# Patient Record
Sex: Male | Born: 1985 | Race: Black or African American | Hispanic: No | Marital: Single | Smoking: Never smoker
Health system: Southern US, Community
[De-identification: ages and names within clinical notes are randomized; demographics above are authoritative.]

## PROBLEM LIST (undated history)

## (undated) DIAGNOSIS — Z8619 Personal history of other infectious and parasitic diseases: Secondary | ICD-10-CM

## (undated) DIAGNOSIS — K589 Irritable bowel syndrome without diarrhea: Secondary | ICD-10-CM

## (undated) DIAGNOSIS — R030 Elevated blood-pressure reading, without diagnosis of hypertension: Secondary | ICD-10-CM

## (undated) DIAGNOSIS — K219 Gastro-esophageal reflux disease without esophagitis: Secondary | ICD-10-CM

## (undated) DIAGNOSIS — E119 Type 2 diabetes mellitus without complications: Secondary | ICD-10-CM

## (undated) DIAGNOSIS — K5900 Constipation, unspecified: Secondary | ICD-10-CM

## (undated) DIAGNOSIS — F419 Anxiety disorder, unspecified: Secondary | ICD-10-CM

## (undated) DIAGNOSIS — R0789 Other chest pain: Secondary | ICD-10-CM

## (undated) HISTORY — DX: Irritable bowel syndrome, unspecified: K58.9

## (undated) HISTORY — PX: NO PAST SURGERIES: SHX2092

---
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
## 2015-05-08 ENCOUNTER — Emergency Department (HOSPITAL_COMMUNITY)
Admission: EM | Admit: 2015-05-08 | Discharge: 2015-05-08 | Disposition: A | Discharge status: Home / Self Care | Payer: Medicare Other | Attending: Emergency Medicine | Admitting: Emergency Medicine

## 2015-05-08 ENCOUNTER — Encounter (HOSPITAL_COMMUNITY): Payer: Self-pay | Admitting: Nurse Practitioner

## 2015-05-08 DIAGNOSIS — R197 Diarrhea, unspecified: Secondary | ICD-10-CM | POA: Diagnosis present

## 2015-05-08 LAB — COMPREHENSIVE METABOLIC PANEL
ALT: 17 U/L (ref 17–63)
AST: 27 U/L (ref 15–41)
Albumin: 3.9 g/dL (ref 3.5–5.0)
Alkaline Phosphatase: 59 U/L (ref 38–126)
Anion gap: 7 (ref 5–15)
BUN: 5 mg/dL — AB (ref 6–20)
CHLORIDE: 105 mmol/L (ref 101–111)
CO2: 27 mmol/L (ref 22–32)
Calcium: 9 mg/dL (ref 8.9–10.3)
Creatinine, Ser: 0.96 mg/dL (ref 0.61–1.24)
GFR calc Af Amer: >60 mL/min (ref >60)
GFR calc non Af Amer: >60 mL/min (ref >60)
Glucose, Bld: 93 mg/dL (ref 65–99)
Potassium: 4 mmol/L (ref 3.5–5.1)
SODIUM: 139 mmol/L (ref 135–145)
Total Bilirubin: 0.6 mg/dL (ref 0.3–1.2)
Total Protein: 7.5 g/dL (ref 6.5–8.1)

## 2015-05-08 LAB — CBC WITH DIFFERENTIAL/PLATELET
BASOS ABS: 0.1 10*3/uL (ref 0.0–0.1)
Basophils Relative: 1 % (ref 0–1)
EOS ABS: 0.3 10*3/uL (ref 0.0–0.7)
EOS PCT: 5 % (ref 0–5)
HCT: 45.4 % (ref 39.0–52.0)
Hemoglobin: 15.3 g/dL (ref 13.0–17.0)
LYMPHS ABS: 2.1 10*3/uL (ref 0.7–4.0)
Lymphocytes Relative: 33 % (ref 12–46)
MCH: 27.9 pg (ref 26.0–34.0)
MCHC: 33.7 g/dL (ref 30.0–36.0)
MCV: 82.7 fL (ref 78.0–100.0)
Monocytes Absolute: 1.1 10*3/uL — ABNORMAL HIGH (ref 0.1–1.0)
Monocytes Relative: 17 % — ABNORMAL HIGH (ref 3–12)
Neutro Abs: 2.9 10*3/uL (ref 1.7–7.7)
Neutrophils Relative %: 44 % (ref 43–77)
Platelets: 222 10*3/uL (ref 150–400)
RBC: 5.49 MIL/uL (ref 4.22–5.81)
RDW: 12.3 % (ref 11.5–15.5)
WBC: 6.5 10*3/uL (ref 4.0–10.5)

## 2015-05-08 MED ORDER — SODIUM CHLORIDE 0.9 % IV BOLUS (SEPSIS)
1000.0000 mL | Freq: Once | INTRAVENOUS | Status: AC
  Administered 2015-05-08: 1000 mL via INTRAVENOUS

## 2015-05-08 NOTE — ED Notes (Signed)
Patient was able to drink fluids with no trouble. Verbalizes no needs at this time.

## 2015-05-08 NOTE — ED Notes (Signed)
PA at the bedside.

## 2015-05-08 NOTE — ED Provider Notes (Signed)
CSN: [id number]     Arrival date & time 05/08/15  3875 History   First MD Initiated Contact with Patient 05/08/15 [phone]     Chief Complaint  Patient presents with  . Diarrhea     (Consider location/radiation/quality/duration/timing/severity/associated sxs/prior Treatment) The history is provided by the patient and medical records. No language interpreter was used.     Kazimer Gresser is a 29 y.o. male  with no major medical problems presents to the Emergency Department complaining of intermittent episodes of diarrhea onset 6 days ago.  Pt reports approx 5 episodes of diarrhea each day.  He describes the diarrhea as watery without melena or hematochezia and green/brown in color.  Pt reports feeling mild generalized abd cramping described as a "rumbling" just before diarrhea that resolves after a BM.  He has been eating and drinking without difficulty; no vomiting or nausea.  Pt reports he was around a friend last week with the same symptoms.  Pt reports taking OTC anti-diarrheal agents and Pepto without relief.  Pt reports he has been urinating without difficulty, clear in color and without dysuria or hematuria.  Pt denies recent travel or exposure to people who have traveled.  Pt denies hx of abd surgery.   Pt denies fever, chills, headache, neck pain, chest pain, SOB, syncope.    Patient denies recent travel or antibiotic usage.   History reviewed. No pertinent past medical history. History reviewed. No pertinent past surgical history. No family history on file. History  Substance Use Topics  . Smoking status: Never Smoker   . Smokeless tobacco: Not on file  . Alcohol Use: No    Review of Systems  Constitutional: Negative for fever, diaphoresis, appetite change, fatigue and unexpected weight change.  HENT: Negative for mouth sores.   Eyes: Negative for visual disturbance.  Respiratory: Negative for cough, chest tightness, shortness of breath and wheezing.   Cardiovascular: Negative for  chest pain.  Gastrointestinal: Positive for diarrhea. Negative for nausea, vomiting, abdominal pain and constipation.  Endocrine: Negative for polydipsia, polyphagia and polyuria.  Genitourinary: Negative for dysuria, urgency, frequency and hematuria.  Musculoskeletal: Negative for back pain and neck stiffness.  Skin: Negative for rash.  Allergic/Immunologic: Negative for immunocompromised state.  Neurological: Negative for syncope, light-headedness and headaches.  Hematological: Does not bruise/bleed easily.  Psychiatric/Behavioral: Negative for sleep disturbance. The patient is not nervous/anxious.        Allergies  Review of patient's allergies indicates no known allergies.  Home Medications   Prior to Admission medications   Medication Sig Start Date End Date Taking? Authorizing Provider  bismuth subsalicylate (PEPTO BISMOL) 262 MG/15ML suspension Take 30 mLs by mouth every 6 (six) hours as needed for diarrhea or loose stools.   Yes Historical Provider, MD  loperamide (IMODIUM) 2 MG capsule Take 4 mg by mouth as needed for diarrhea or loose stools.   Yes Historical Provider, MD   BP 150/78 mmHg  Pulse 89  Temp(Src) 99.1 F (37.3 C) (Oral)  Resp 16  SpO2 100% Physical Exam  Constitutional: He appears well-developed and well-nourished. No distress.  Awake, alert, nontoxic appearance  HENT:  Head: Normocephalic and atraumatic.  Mouth/Throat: Oropharynx is clear and moist. No oropharyngeal exudate.  Eyes: Conjunctivae are normal. No scleral icterus.  Neck: Normal range of motion. Neck supple.  Cardiovascular: Normal rate, regular rhythm, normal heart sounds and intact distal pulses.   No murmur heard. Pulmonary/Chest: Effort normal and breath sounds normal. No respiratory distress. He has no wheezes.  Equal chest expansion  Abdominal: Soft. Bowel sounds are normal. He exhibits no distension and no mass. There is no tenderness. There is no rebound and no guarding.   Musculoskeletal: Normal range of motion. He exhibits no edema.  Neurological: He is alert.  Speech is clear and goal oriented Moves extremities without ataxia  Skin: Skin is warm and dry. He is not diaphoretic.  Psychiatric: He has a normal mood and affect.  Nursing note and vitals reviewed.   ED Course  Procedures (including critical care time) Labs Review Labs Reviewed - No data to display  Imaging Review No results found.   EKG Interpretation None      MDM   Final diagnoses:  None   Tulio Facundo presents with 6 days of loose stools and diarrhea. No complaints of bloody stools. No travel or in a bionic usage. Patient with sick contact with similar symptoms last week. Likely viral. Will give fluids, check labs and obtain stool sample is able. Patient is well-appearing with benign abdomen.  1:17 PM Pt with benign abd on repeat exam.  No diarrhea here.  Pt reports feeling well.  Labs reassuring without leukocytosis, hypokalemia or anemia.  No elevation in serum creatinine to suggest severe dehydration.  Patient reports that he feels well.  No further episodes of diarrhea here in the emergency department. Patient will be discharged home with strict return precautions and instructions to use over-the-counter anti-diarrheal.  He is to f/u with his PCP.  BP 123/77 mmHg  Pulse 80  Temp(Src) 99.4 F (37.4 C) (Oral)  Resp 16  SpO2 99%   Dierdre Forth, PA-C 05/08/15 1727  Gwyneth Sprout, MD 05/12/15 [phone]

## 2015-05-08 NOTE — ED Notes (Signed)
Pt endorses being around friend with diarrhea last week and he started having diarrhea on Monday and has not resolved. Pt has taken peptobismal and OTC anti-diarrheal without relief. Paitient denies recent abx use. Patient denies abdominal pain, nausea or vomiting, dysuria. Patient endorses clear yellow urine. Denies seeing blood in stools. Pt sts has had 2 episodes of diarrhea in the last 24 hours.

## 2015-05-08 NOTE — Discharge Instructions (Signed)
1. Medications: Imodium, usual home medications 2. Treatment: rest, drink plenty of fluids, advance diet slowly 3. Follow Up: Please followup with your primary doctor in 2 days for discussion of your diagnoses and further evaluation after today's visit; if you do not have a primary care doctor use the resource guide provided to find one; Please return to the ER for persistent vomiting, high fevers or worsening symptoms    Diarrhea Diarrhea is frequent loose and watery bowel movements. It can cause you to feel weak and dehydrated. Dehydration can cause you to become tired and thirsty, have a dry mouth, and have decreased urination that often is dark yellow. Diarrhea is a sign of another problem, most often an infection that will not last long. In most cases, diarrhea typically lasts 2-3 days. However, it can last longer if it is a sign of something more serious. It is important to treat your diarrhea as directed by your caregiver to lessen or prevent future episodes of diarrhea. CAUSES  Some common causes include:  Gastrointestinal infections caused by viruses, bacteria, or parasites.  Food poisoning or food allergies.  Certain medicines, such as antibiotics, chemotherapy, and laxatives.  Artificial sweeteners and fructose.  Digestive disorders. HOME CARE INSTRUCTIONS  Ensure adequate fluid intake (hydration): Have 1 cup (8 oz) of fluid for each diarrhea episode. Avoid fluids that contain simple sugars or sports drinks, fruit juices, whole milk products, and sodas. Your urine should be clear or pale yellow if you are drinking enough fluids. Hydrate with an oral rehydration solution that you can purchase at pharmacies, retail stores, and online. You can prepare an oral rehydration solution at home by mixing the following ingredients together:   - tsp table salt.   tsp baking soda.   tsp salt substitute containing potassium chloride.  1  tablespoons sugar.  1 L (34 oz) of  water.  Certain foods and beverages may increase the speed at which food moves through the gastrointestinal (GI) tract. These foods and beverages should be avoided and include:  Caffeinated and alcoholic beverages.  High-fiber foods, such as raw fruits and vegetables, nuts, seeds, and whole grain breads and cereals.  Foods and beverages sweetened with sugar alcohols, such as xylitol, sorbitol, and mannitol.  Some foods may be well tolerated and may help thicken stool including:  Starchy foods, such as rice, toast, pasta, low-sugar cereal, oatmeal, grits, baked potatoes, crackers, and bagels.  Bananas.  Applesauce.  Add probiotic-rich foods to help increase healthy bacteria in the GI tract, such as yogurt and fermented milk products.  Wash your hands well after each diarrhea episode.  Only take over-the-counter or prescription medicines as directed by your caregiver.  Take a warm bath to relieve any burning or pain from frequent diarrhea episodes. SEEK IMMEDIATE MEDICAL CARE IF:   You are unable to keep fluids down.  You have persistent vomiting.  You have blood in your stool, or your stools are black and tarry.  You do not urinate in 6-8 hours, or there is only a small amount of very dark urine.  You have abdominal pain that increases or localizes.  You have weakness, dizziness, confusion, or light-headedness.  You have a severe headache.  Your diarrhea gets worse or does not get better.  You have a fever or persistent symptoms for more than 2-3 days.  You have a fever and your symptoms suddenly get worse. MAKE SURE YOU:   Understand these instructions.  Will watch your condition.  Will get help  right away if you are not doing well or get worse. Document Released: 11/02/2002 Document Revised: 03/29/2014 Document Reviewed: 07/20/2012 [facility] Patient Information 2015 [place], [place]. This information is not intended to replace advice given to you by your health  care provider. Make sure you discuss any questions you have with your health care provider.  Food Choices to Help Relieve Diarrhea When you have diarrhea, the foods you eat and your eating habits are very important. Choosing the right foods and drinks can help relieve diarrhea. Also, because diarrhea can last up to 7 days, you need to replace lost fluids and electrolytes (such as sodium, potassium, and chloride) in order to help prevent dehydration.  WHAT GENERAL GUIDELINES DO I NEED TO FOLLOW?  Slowly drink 1 cup (8 oz) of fluid for each episode of diarrhea. If you are getting enough fluid, your urine will be clear or pale yellow.  Eat starchy foods. Some good choices include white rice, white toast, pasta, low-fiber cereal, baked potatoes (without the skin), saltine crackers, and bagels.  Avoid large servings of any cooked vegetables.  Limit fruit to two servings per day. A serving is  cup or 1 small piece.  Choose foods with less than 2 g of fiber per serving.  Limit fats to less than 8 tsp (38 g) per day.  Avoid fried foods.  Eat foods that have probiotics in them. Probiotics can be found in certain dairy products.  Avoid foods and beverages that may increase the speed at which food moves through the stomach and intestines (gastrointestinal tract). Things to avoid include:  High-fiber foods, such as dried fruit, raw fruits and vegetables, nuts, seeds, and whole grain foods.  Spicy foods and high-fat foods.  Foods and beverages sweetened with high-fructose corn syrup, honey, or sugar alcohols such as xylitol, sorbitol, and mannitol. WHAT FOODS ARE RECOMMENDED? Grains White rice. White, [place], or pita breads (fresh or toasted), including plain rolls, buns, or bagels. White pasta. Saltine, soda, or graham crackers. Pretzels. Low-fiber cereal. Cooked cereals made with water (such as cornmeal, farina, or cream cereals). Plain muffins. Matzo. Melba toast. Zwieback.   Vegetables Potatoes (without the skin). Strained tomato and vegetable juices. Most well-cooked and canned vegetables without seeds. Tender lettuce. Fruits Cooked or canned applesauce, apricots, cherries, fruit cocktail, grapefruit, peaches, pears, or plums. Fresh bananas, apples without skin, cherries, grapes, cantaloupe, grapefruit, peaches, oranges, or plums.  Meat and Other Protein Products Baked or boiled chicken. Eggs. Tofu. Fish. Seafood. Smooth peanut butter. Ground or well-cooked tender beef, ham, veal, lamb, pork, or poultry.  Dairy Plain yogurt, kefir, and unsweetened liquid yogurt. Lactose-free milk, buttermilk, or soy milk. Plain hard cheese. Beverages Sport drinks. Clear broths. Diluted fruit juices (except prune). Regular, caffeine-free sodas such as ginger ale. Water. Decaffeinated teas. Oral rehydration solutions. Sugar-free beverages not sweetened with sugar alcohols. Other Bouillon, broth, or soups made from recommended foods.  The items listed above may not be a complete list of recommended foods or beverages. Contact your dietitian for more options. WHAT FOODS ARE NOT RECOMMENDED? Grains Whole grain, whole wheat, bran, or rye breads, rolls, pastas, crackers, and cereals. Wild or brown rice. Cereals that contain more than 2 g of fiber per serving. Corn tortillas or taco shells. Cooked or dry oatmeal. Granola. Popcorn. Vegetables Raw vegetables. Cabbage, broccoli, Brussels sprouts, artichokes, baked beans, beet greens, corn, kale, legumes, peas, sweet potatoes, and yams. Potato skins. Cooked spinach and cabbage. Fruits Dried fruit, including raisins and dates. Raw fruits. Stewed or  dried prunes. Fresh apples with skin, apricots, mangoes, pears, raspberries, and strawberries.  Meat and Other Protein Products Chunky peanut butter. Nuts and seeds. Beans and lentils. Tomasa Blase.  Dairy High-fat cheeses. Milk, chocolate milk, and beverages made with milk, such as milk shakes. Cream.  Ice cream. Sweets and Desserts Sweet rolls, doughnuts, and sweet breads. Pancakes and waffles. Fats and Oils Butter. Cream sauces. Margarine. Salad oils. Plain salad dressings. Olives. Avocados.  Beverages Caffeinated beverages (such as coffee, tea, soda, or energy drinks). Alcoholic beverages. Fruit juices with pulp. Prune juice. Soft drinks sweetened with high-fructose corn syrup or sugar alcohols. Other Coconut. Hot sauce. Chili powder. Mayonnaise. Gravy. Cream-based or milk-based soups.  The items listed above may not be a complete list of foods and beverages to avoid. Contact your dietitian for more information. WHAT SHOULD I DO IF I BECOME DEHYDRATED? Diarrhea can sometimes lead to dehydration. Signs of dehydration include dark urine and dry mouth and skin. If you think you are dehydrated, you should rehydrate with an oral rehydration solution. These solutions can be purchased at pharmacies, retail stores, or online.  Drink -1 cup (120-240 mL) of oral rehydration solution each time you have an episode of diarrhea. If drinking this amount makes your diarrhea worse, try drinking smaller amounts more often. For example, drink 1-3 tsp (5-15 mL) every 5-10 minutes.  A general rule for staying hydrated is to drink 1-2 L of fluid per day. Talk to your health care provider about the specific amount you should be drinking each day. Drink enough fluids to keep your urine clear or pale yellow. Document Released: 02/02/2004 Document Revised: 11/17/2013 Document Reviewed: 10/05/2013 [facility] Patient Information 2015 [place], [place]. This information is not intended to replace advice given to you by your health care provider. Make sure you discuss any questions you have with your health care provider.

## 2015-06-13 ENCOUNTER — Emergency Department (HOSPITAL_COMMUNITY)
Admission: EM | Admit: 2015-06-13 | Discharge: 2015-06-13 | Disposition: A | Discharge status: Home / Self Care | Payer: Medicare Other | Attending: Emergency Medicine | Admitting: Emergency Medicine

## 2015-06-13 ENCOUNTER — Encounter (HOSPITAL_COMMUNITY): Payer: Self-pay | Admitting: Emergency Medicine

## 2015-06-13 DIAGNOSIS — N342 Other urethritis: Secondary | ICD-10-CM

## 2015-06-13 DIAGNOSIS — R300 Dysuria: Secondary | ICD-10-CM | POA: Diagnosis present

## 2015-06-13 LAB — URINE MICROSCOPIC-ADD ON

## 2015-06-13 LAB — URINALYSIS, ROUTINE W REFLEX MICROSCOPIC
BILIRUBIN URINE: NEGATIVE
Glucose, UA: NEGATIVE mg/dL
HGB URINE DIPSTICK: NEGATIVE
KETONES UR: NEGATIVE mg/dL
Nitrite: NEGATIVE
Protein, ur: NEGATIVE mg/dL
Specific Gravity, Urine: 1.023 (ref 1.005–1.030)
Urobilinogen, UA: 0.2 mg/dL (ref 0.0–1.0)
pH: 8 (ref 5.0–8.0)

## 2015-06-13 MED ORDER — STERILE WATER FOR INJECTION IJ SOLN
INTRAMUSCULAR | Status: AC
  Filled 2015-06-13: qty 10

## 2015-06-13 MED ORDER — CEFTRIAXONE SODIUM 250 MG IJ SOLR
250.0000 mg | Freq: Once | INTRAMUSCULAR | Status: AC
  Administered 2015-06-13: 250 mg via INTRAMUSCULAR
  Filled 2015-06-13: qty 250

## 2015-06-13 MED ORDER — AZITHROMYCIN 250 MG PO TABS
1000.0000 mg | ORAL_TABLET | Freq: Once | ORAL | Status: AC
  Administered 2015-06-13: 1000 mg via ORAL
  Filled 2015-06-13: qty 4

## 2015-06-13 NOTE — Discharge Instructions (Signed)
No intercourse for a week. Follow up with health dept.    Urethritis Urethritis is an inflammation of the tube through which urine exits your bladder (urethra).  CAUSES Urethritis is often caused by an infection in your urethra. The infection can be viral, like herpes. The infection can also be bacterial, like gonorrhea. RISK FACTORS Risk factors of urethritis include:  Having sex without using a condom.  Having multiple sexual partners.  Having poor hygiene. SIGNS AND SYMPTOMS Symptoms of urethritis are less noticeable in women than in men. These symptoms include:  Burning feeling when you urinate (dysuria).  Discharge from your urethra.  Blood in your urine (hematuria).  Urinating more than usual. DIAGNOSIS  To confirm a diagnosis of urethritis, your health care provider will do the following:  Ask about your sexual history.  Perform a physical exam.  Have you provide a sample of your urine for lab testing.  Use a cotton swab to gently collect a sample from your urethra for lab testing. TREATMENT  It is important to treat urethritis. Depending on the cause, untreated urethritis may lead to serious genital infections and possibly infertility. Urethritis caused by a bacterial infection is treated with antibiotic medicine. All sexual partners must be treated.  HOME CARE INSTRUCTIONS  Do not have sex until the test results are known and treatment is completed, even if your symptoms go away before you finish treatment.  If you were prescribed an antibiotic, finish it all even if you start to feel better. SEEK MEDICAL CARE IF:   Your symptoms are not improved in 3 days.  Your symptoms are getting worse.  You develop abdominal pain or pelvic pain (in women).  You develop joint pain.  You have a fever. SEEK IMMEDIATE MEDICAL CARE IF:   You have severe pain in the belly, back, or side.  You have repeated vomiting. MAKE SURE YOU:  Understand these  instructions.  Will watch your condition.  Will get help right away if you are not doing well or get worse. Document Released: 05/08/2001 Document Revised: 03/29/2014 Document Reviewed: 07/13/2013 [facility] Patient Information [place]. This information is not intended to replace advice given to you by your health care provider. Make sure you discuss any questions you have with your health care provider.

## 2015-06-13 NOTE — ED Provider Notes (Signed)
CSN: [id number]     Arrival date & time 06/13/15  1048 History  This chart was scribed for non-physician practitioner, Lottie Mussel, PA-C, working with Mancel Bale, MD by Charline Bills, ED Scribe. This patient was seen in room TR07C/TR07C and the patient's care was started at 12:58 PM.   Chief Complaint  Patient presents with  . Urinary Tract Infection   The history is provided by the patient. No language interpreter was used.   HPI Comments: Joseph Hendrix is a 29 y.o. male who presents to the Emergency Department complaining of persistent dysuria for the past 2 days. Pt reports unprotected sexual intercourse. He denies penile discharge, urinary frequency, urinary urgency, genital sore. Pt also ndenies similar symptoms.   History reviewed. No pertinent past medical history. History reviewed. No pertinent past surgical history. No family history on file. History  Substance Use Topics  . Smoking status: Never Smoker   . Smokeless tobacco: Not on file  . Alcohol Use: No    Review of Systems  Genitourinary: Positive for dysuria. Negative for urgency, frequency, discharge and genital sores.   Allergies  Review of patient's allergies indicates no known allergies.  Home Medications   Prior to Admission medications   Medication Sig Start Date End Date Taking? Authorizing Provider  bismuth subsalicylate (PEPTO BISMOL) 262 MG/15ML suspension Take 30 mLs by mouth every 6 (six) hours as needed for diarrhea or loose stools.    Historical Provider, MD  loperamide (IMODIUM) 2 MG capsule Take 4 mg by mouth as needed for diarrhea or loose stools.    Historical Provider, MD   BP 136/93 mmHg  Pulse 87  Temp(Src) 99.3 F (37.4 C) (Oral)  Resp 18  Ht  (1.905 m)  Wt 180 lb (81.647 kg)  BMI 22.50 kg/m2  SpO2 98% Physical Exam  Constitutional: He is oriented to person, place, and time. He appears well-developed and well-nourished. No distress.  HENT:  Head: Normocephalic and  atraumatic.  Eyes: Conjunctivae and EOM are normal.  Neck: Neck supple. No tracheal deviation present.  Cardiovascular: Normal rate.   Pulmonary/Chest: Effort normal. No respiratory distress.  Genitourinary:  Normal genitalia, no penile discharge, no lesions.  Musculoskeletal: Normal range of motion.  Neurological: He is alert and oriented to person, place, and time.  Skin: Skin is warm and dry.  Psychiatric: He has a normal mood and affect. His behavior is normal.  Nursing note and vitals reviewed.  ED Course  Procedures (including critical care time) DIAGNOSTIC STUDIES: Oxygen Saturation is 98% on RA, normal by my interpretation.    COORDINATION OF CARE: 1:00 PM-Discussed treatment plan which includes UA and STD screening with pt at bedside and pt agreed to plan.   Labs Review Labs Reviewed  URINALYSIS, ROUTINE W REFLEX MICROSCOPIC (NOT AT [facility]) - Abnormal; Notable for the following:    Leukocytes, UA TRACE (*)    All other components within normal limits  URINE MICROSCOPIC-ADD ON   Imaging Review No results found.   EKG Interpretation None      MDM   Final diagnoses:  Urethritis    patient with dysuria, burning with urination, no other symptoms. Exam unremarkable. GC chlamydia culture sent. I treated him in emergency department for possible STI, given Rocephin 250 mg IM, Zithromax 1 g by mouth. No intercourse for a week. Follow up with health department.   Filed Vitals:   06/13/15 1100 06/13/15 1333  BP: 136/93 133/85  Pulse: 87 73  Temp: 99.3 F (  37.4 C)   TempSrc: Oral   Resp: 18 18  Height: 6\' 3"  (1.905 m)   Weight: 180 lb (81.647 kg)   SpO2: 98% 100%    I personally performed the services described in this documentation, which was scribed in my presence. The recorded information has been reviewed and is accurate.   Jaynie Crumbleatyana Barry Faircloth, PA-C 06/13/15 1345  Mancel BaleElliott Wentz, MD 06/13/15 [phone]

## 2015-06-13 NOTE — ED Notes (Signed)
Pt c/o burning with urination x 2 days. Pt denies discharge.

## 2015-06-14 LAB — URINE CULTURE: Culture: NO GROWTH

## 2015-06-14 LAB — GC/CHLAMYDIA PROBE AMP (~~LOC~~) NOT AT ARMC
Chlamydia: NEGATIVE
Neisseria Gonorrhea: NEGATIVE

## 2016-08-09 DIAGNOSIS — R030 Elevated blood-pressure reading, without diagnosis of hypertension: Secondary | ICD-10-CM | POA: Insufficient documentation

## 2016-08-09 HISTORY — DX: Elevated blood-pressure reading, without diagnosis of hypertension: R03.0

## 2016-09-08 DIAGNOSIS — K219 Gastro-esophageal reflux disease without esophagitis: Secondary | ICD-10-CM | POA: Insufficient documentation

## 2016-09-08 HISTORY — DX: Gastro-esophageal reflux disease without esophagitis: K21.9

## 2018-08-20 ENCOUNTER — Encounter: Payer: Self-pay | Admitting: Emergency Medicine

## 2018-08-20 ENCOUNTER — Ambulatory Visit (INDEPENDENT_AMBULATORY_CARE_PROVIDER_SITE_OTHER): Payer: Medicare Other | Admitting: Emergency Medicine

## 2018-08-20 ENCOUNTER — Other Ambulatory Visit: Payer: Self-pay

## 2018-08-20 VITALS — BP 113/68 | HR 80 | Temp 98.9°F | Resp 16 | Ht 72.5 in | Wt 222.2 lb

## 2018-08-20 DIAGNOSIS — Z202 Contact with and (suspected) exposure to infections with a predominantly sexual mode of transmission: Secondary | ICD-10-CM

## 2018-08-20 DIAGNOSIS — R4582 Worries: Secondary | ICD-10-CM | POA: Diagnosis not present

## 2018-08-20 NOTE — Progress Notes (Signed)
Joseph Hendrix 32 y.o.   Chief Complaint  Patient presents with  . STD TEST    and HIV testing per patient no exposure    HISTORY OF PRESENT ILLNESS: This is a 32 y.o. male concerned and worried about STDs.  Possible exposure but no symptoms.  HPI   Prior to Admission medications   Medication Sig Start Date End Date Taking? Authorizing Provider  bismuth subsalicylate (PEPTO BISMOL) 262 MG/15ML suspension Take 30 mLs by mouth every 6 (six) hours as needed for diarrhea or loose stools.    [provider]  loperamide (IMODIUM) 2 MG capsule Take 4 mg by mouth as needed for diarrhea or loose stools.    [provider]    No Known Allergies  There are no active problems to display for this patient.   No past medical history on file.  No past surgical history on file.  Social History   Socioeconomic History  . Marital status: Single    Spouse name: Not on file  . Number of children: Not on file  . Years of education: Not on file  . Highest education level: Not on file  Occupational History  . Not on file  Social Needs  . Financial resource strain: Not on file  . Food insecurity:    Worry: Not on file    Inability: Not on file  . Transportation needs:    Medical: Not on file    Non-medical: Not on file  Tobacco Use  . Smoking status: Never Smoker  . Smokeless tobacco: Never Used  Substance and Sexual Activity  . Alcohol use: No  . Drug use: No  . Sexual activity: Never  Lifestyle  . Physical activity:    Days per week: Not on file    Minutes per session: Not on file  . Stress: Not on file  Relationships  . Social connections:    Talks on phone: Not on file    Gets together: Not on file    Attends religious service: Not on file    Active member of club or organization: Not on file    Attends meetings of clubs or organizations: Not on file    Relationship status: Not on file  . Intimate partner violence:    Fear of current or ex partner:  Not on file    Emotionally abused: Not on file    Physically abused: Not on file    Forced sexual activity: Not on file  Other Topics Concern  . Not on file  Social History Narrative  . Not on file    Family History  Problem Relation Age of Onset  . Diabetes Mother   . Hyperlipidemia Mother   . Diabetes Father   . Hyperlipidemia Father   . Diabetes Brother   . Hyperlipidemia Brother      Review of Systems  Constitutional: Negative.  Negative for chills and fever.  HENT: Negative for sore throat.   Eyes: Negative for discharge and redness.  Respiratory: Negative for cough and shortness of breath.   Cardiovascular: Negative for chest pain and palpitations.  Gastrointestinal: Negative for abdominal pain, diarrhea, nausea and vomiting.  Genitourinary: Negative.  Negative for dysuria and urgency.  Skin: Negative.  Negative for rash.  Neurological: Negative.  Negative for dizziness and headaches.  Endo/Heme/Allergies: Negative.   All other systems reviewed and are negative.   Vitals:   08/20/18 1406  BP: 113/68  Pulse: 80  Resp: 16  Temp: 98.9 F (  37.2 C)  SpO2: 98%    Physical Exam  Constitutional: He is oriented to person, place, and time. He appears well-developed and well-nourished.  HENT:  Head: Normocephalic and atraumatic.  Eyes: Pupils are equal, round, and reactive to light. EOM are normal.  Neck: Normal range of motion. Neck supple.  Cardiovascular: Normal rate.  Pulmonary/Chest: Effort normal.  Musculoskeletal: Normal range of motion.  Neurological: He is alert and oriented to person, place, and time.  Skin: Skin is warm and dry. Capillary refill takes less than 2 seconds.  Psychiatric: He has a normal mood and affect. His behavior is normal.  Vitals reviewed.    ASSESSMENT & PLAN: Joseph Hendrix was seen today for std test.  Diagnoses and all orders for this visit:  Worries  Possible exposure to STD -     Urine Culture -     RPR -     HIV Antibody  (routine testing w rflx) -     GC/Chlamydia Probe Amp   Patient Instructions       If you have lab work done today you will be contacted with your lab results within the next 2 weeks.  If you have not heard from [place] then please contact us. The fastest way to get your results is to register for My Chart.   IF you received an x-ray today, you will receive an invoice from [facility]. Please contact [facility] at [phone] with questions or concerns regarding your invoice.   IF you received labwork today, you will receive an invoice from [place]. Please contact [facility] at [phone] with questions or concerns regarding your invoice.   Our billing staff will not be able to assist you with questions regarding bills from these companies.  You will be contacted with the lab results as soon as they are available. The fastest way to get your results is to activate your My Chart account. Instructions are located on the last page of this paperwork. If you have not heard from [place] regarding the results in 2 weeks, please contact this office.    Sexually Transmitted Disease A sexually transmitted disease (STD) is a disease or infection that may be passed (transmitted) from person to person, usually during sexual activity. This may happen by way of saliva, semen, blood, vaginal mucus, or urine. Common STDs include:  Gonorrhea.  Chlamydia.  Syphilis.  HIV and AIDS.  Genital herpes.  Hepatitis B and C.  Trichomonas.  Human papillomavirus (HPV).  Pubic lice.  Scabies.  Mites.  Bacterial vaginosis.  What are the causes? An STD may be caused by bacteria, a virus, or parasites. STDs are often transmitted during sexual activity if one person is infected. However, they may also be transmitted through nonsexual means. STDs may be transmitted after:  Sexual intercourse with an infected person.  Sharing sex toys with an infected person.  Sharing needles with an  infected person or using unclean piercing or tattoo needles.  Having intimate contact with the genitals, mouth, or rectal areas of an infected person.  Exposure to infected fluids during birth.  What are the signs or symptoms? Different STDs have different symptoms. Some people may not have any symptoms. If symptoms are present, they may include:  Painful or bloody urination.  Pain in the pelvis, abdomen, vagina, anus, throat, or eyes.  A skin rash, itching, or irritation.  Growths, ulcerations, blisters, or sores in the genital and anal areas.  Abnormal vaginal discharge with or without bad odor.  Penile  discharge in men.  Fever.  Pain or bleeding during sexual intercourse.  Swollen glands in the groin area.  Yellow skin and eyes (jaundice). This is seen with hepatitis.  Swollen testicles.  Infertility.  Sores and blisters in the mouth.  How is this diagnosed? To make a diagnosis, your health care provider may:  Take a medical history.  Perform a physical exam.  Take a sample of any discharge to examine.  Swab the throat, cervix, opening to the penis, rectum, or vagina for testing.  Test a sample of your first morning urine.  Perform blood tests.  Perform a Pap test, if this applies.  Perform a colposcopy.  Perform a laparoscopy.  How is this treated? Treatment depends on the STD. Some STDs may be treated but not cured.  Chlamydia, gonorrhea, trichomonas, and syphilis can be cured with antibiotic medicine.  Genital herpes, hepatitis, and HIV can be treated, but not cured, with prescribed medicines. The medicines lessen symptoms.  Genital warts from HPV can be treated with medicine or by freezing, burning (electrocautery), or surgery. Warts may come back.  HPV cannot be cured with medicine or surgery. However, abnormal areas may be removed from the cervix, vagina, or vulva.  If your diagnosis is confirmed, your recent sexual partners need treatment.  This is true even if they are symptom-free or have a negative culture or evaluation. They should not have sex until their health care providers say it is okay.  Your health care provider may test you for infection again 3 months after treatment.  How is this prevented? Take these steps to reduce your risk of getting an STD:  Use latex condoms, dental dams, and water-soluble lubricants during sexual activity. Do not use petroleum jelly or oils.  Avoid having multiple sex partners.  Do not have sex with someone who has other sex partners.  Do not have sex with anyone you do not know or who is at high risk for an STD.  Avoid risky sex practices that can break your skin.  Do not have sex if you have open sores on your mouth or skin.  Avoid drinking too much alcohol or taking illegal drugs. Alcohol and drugs can affect your judgment and put you in a vulnerable position.  Avoid engaging in oral and anal sex acts.  Get vaccinated for HPV and hepatitis. If you have not received these vaccines in the past, talk to your health care provider about whether one or both might be right for you.  If you are at risk of being infected with HIV, it is recommended that you take a prescription medicine daily to prevent HIV infection. This is called pre-exposure prophylaxis (PrEP). You are considered at risk if: ? You are a man who has sex with other men (MSM). ? You are a heterosexual man or woman and are sexually active with more than one partner. ? You take drugs by injection. ? You are sexually active with a partner who has HIV.  Talk with your health care provider about whether you are at high risk of being infected with HIV. If you choose to begin PrEP, you should first be tested for HIV. You should then be tested every 3 months for as long as you are taking PrEP.  Contact a health care provider if:  See your health care provider.  Tell your sexual partner(s). They should be tested and treated  for any STDs.  Do not have sex until your health care provider says  it is okay. Get help right away if: Contact your health care provider right away if:  You have severe abdominal pain.  You are a man and notice swelling or pain in your testicles.  You are a woman and notice swelling or pain in your vagina.  This information is not intended to replace advice given to you by your health care provider. Make sure you discuss any questions you have with your health care provider. Document Released: 02/02/2003 Document Revised: 06/01/2016 Document Reviewed: 06/02/2013 Elsevier Interactive Patient Education  2018 Elsevier Inc.      Edwina BarthMiguel Bert Givans, MD [facility]

## 2018-08-20 NOTE — Patient Instructions (Addendum)
If you have lab work done today you will be contacted with your lab results within the next 2 weeks.  If you have not heard from us then please contact us. The fastest way to get your results is to register for My Chart.   IF you received an x-ray today, you will receive an invoice from [facility]. Please contact [facility] at [phone] with questions or concerns regarding your invoice.   IF you received labwork today, you will receive an invoice from [facility]. Please contact [facility] at [phone] with questions or concerns regarding your invoice.   Our billing staff will not be able to assist you with questions regarding bills from these companies.  You will be contacted with the lab results as soon as they are available. The fastest way to get your results is to activate your My Chart account. Instructions are located on the last page of this paperwork. If you have not heard from us regarding the results in 2 weeks, please contact this office.    Sexually Transmitted Disease A sexually transmitted disease (STD) is a disease or infection that may be passed (transmitted) from person to person, usually during sexual activity. This may happen by way of saliva, semen, blood, vaginal mucus, or urine. Common STDs include:  Gonorrhea.  Chlamydia.  Syphilis.  HIV and AIDS.  Genital herpes.  Hepatitis B and C.  Trichomonas.  Human papillomavirus (HPV).  Pubic lice.  Scabies.  Mites.  Bacterial vaginosis.  What are the causes? An STD may be caused by bacteria, a virus, or parasites. STDs are often transmitted during sexual activity if one person is infected. However, they may also be transmitted through nonsexual means. STDs may be transmitted after:  Sexual intercourse with an infected person.  Sharing sex toys with an infected person.  Sharing needles with an infected person or using unclean piercing or tattoo needles.  Having intimate  contact with the genitals, mouth, or rectal areas of an infected person.  Exposure to infected fluids during birth.  What are the signs or symptoms? Different STDs have different symptoms. Some people may not have any symptoms. If symptoms are present, they may include:  Painful or bloody urination.  Pain in the pelvis, abdomen, vagina, anus, throat, or eyes.  A skin rash, itching, or irritation.  Growths, ulcerations, blisters, or sores in the genital and anal areas.  Abnormal vaginal discharge with or without bad odor.  Penile discharge in men.  Fever.  Pain or bleeding during sexual intercourse.  Swollen glands in the groin area.  Yellow skin and eyes (jaundice). This is seen with hepatitis.  Swollen testicles.  Infertility.  Sores and blisters in the mouth.  How is this diagnosed? To make a diagnosis, your health care provider may:  Take a medical history.  Perform a physical exam.  Take a sample of any discharge to examine.  Swab the throat, cervix, opening to the penis, rectum, or vagina for testing.  Test a sample of your first morning urine.  Perform blood tests.  Perform a Pap test, if this applies.  Perform a colposcopy.  Perform a laparoscopy.  How is this treated? Treatment depends on the STD. Some STDs may be treated but not cured.  Chlamydia, gonorrhea, trichomonas, and syphilis can be cured with antibiotic medicine.  Genital herpes, hepatitis, and HIV can be treated, but not cured, with prescribed medicines. The medicines lessen symptoms.  Genital warts from HPV can be treated with medicine  or by freezing, burning (electrocautery), or surgery. Warts may come back.  HPV cannot be cured with medicine or surgery. However, abnormal areas may be removed from the cervix, vagina, or vulva.  If your diagnosis is confirmed, your recent sexual partners need treatment. This is true even if they are symptom-free or have a negative culture or  evaluation. They should not have sex until their health care providers say it is okay.  Your health care provider may test you for infection again 3 months after treatment.  How is this prevented? Take these steps to reduce your risk of getting an STD:  Use latex condoms, dental dams, and water-soluble lubricants during sexual activity. Do not use petroleum jelly or oils.  Avoid having multiple sex partners.  Do not have sex with someone who has other sex partners.  Do not have sex with anyone you do not know or who is at high risk for an STD.  Avoid risky sex practices that can break your skin.  Do not have sex if you have open sores on your mouth or skin.  Avoid drinking too much alcohol or taking illegal drugs. Alcohol and drugs can affect your judgment and put you in a vulnerable position.  Avoid engaging in oral and anal sex acts.  Get vaccinated for HPV and hepatitis. If you have not received these vaccines in the past, talk to your health care provider about whether one or both might be right for you.  If you are at risk of being infected with HIV, it is recommended that you take a prescription medicine daily to prevent HIV infection. This is called pre-exposure prophylaxis (PrEP). You are considered at risk if: ? You are a man who has sex with other men (MSM). ? You are a heterosexual man or woman and are sexually active with more than one partner. ? You take drugs by injection. ? You are sexually active with a partner who has HIV.  Talk with your health care provider about whether you are at high risk of being infected with HIV. If you choose to begin PrEP, you should first be tested for HIV. You should then be tested every 3 months for as long as you are taking PrEP.  Contact a health care provider if:  See your health care provider.  Tell your sexual partner(s). They should be tested and treated for any STDs.  Do not have sex until your health care provider says it  is okay. Get help right away if: Contact your health care provider right away if:  You have severe abdominal pain.  You are a man and notice swelling or pain in your testicles.  You are a woman and notice swelling or pain in your vagina.  This information is not intended to replace advice given to you by your health care provider. Make sure you discuss any questions you have with your health care provider. Document Released: 02/02/2003 Document Revised: 06/01/2016 Document Reviewed: 06/02/2013 Elsevier Interactive Patient Education  2018 [employer] Inc.  

## 2018-08-21 LAB — URINE CULTURE

## 2018-08-21 LAB — HIV ANTIBODY (ROUTINE TESTING W REFLEX): HIV SCREEN 4TH GENERATION: NONREACTIVE

## 2018-08-21 LAB — RPR: RPR Ser Ql: NONREACTIVE

## 2018-08-22 ENCOUNTER — Encounter: Payer: Self-pay | Admitting: *Deleted

## 2018-08-22 LAB — GC/CHLAMYDIA PROBE AMP
Chlamydia trachomatis, NAA: NEGATIVE
Neisseria gonorrhoeae by PCR: NEGATIVE

## 2019-01-28 ENCOUNTER — Emergency Department (HOSPITAL_COMMUNITY): Payer: Medicare Other

## 2019-01-28 ENCOUNTER — Emergency Department (HOSPITAL_COMMUNITY)
Admission: EM | Admit: 2019-01-28 | Discharge: 2019-01-28 | Disposition: A | Discharge status: Home / Self Care | Payer: Medicare Other | Attending: Emergency Medicine | Admitting: Emergency Medicine

## 2019-01-28 ENCOUNTER — Encounter (HOSPITAL_COMMUNITY): Payer: Self-pay | Admitting: *Deleted

## 2019-01-28 ENCOUNTER — Other Ambulatory Visit: Payer: Self-pay

## 2019-01-28 DIAGNOSIS — K5900 Constipation, unspecified: Secondary | ICD-10-CM | POA: Diagnosis not present

## 2019-01-28 DIAGNOSIS — R0789 Other chest pain: Secondary | ICD-10-CM | POA: Insufficient documentation

## 2019-01-28 DIAGNOSIS — R070 Pain in throat: Secondary | ICD-10-CM | POA: Diagnosis not present

## 2019-01-28 DIAGNOSIS — J029 Acute pharyngitis, unspecified: Secondary | ICD-10-CM

## 2019-01-28 MED ORDER — DOCUSATE SODIUM 250 MG PO CAPS: 250.0000 mg | ORAL_CAPSULE | Freq: Every day | ORAL | 0 refills | Status: DC

## 2019-01-28 NOTE — ED Provider Notes (Signed)
[facility] EMERGENCY DEPARTMENT Provider Note   CSN: [id number] Arrival date & time: 01/28/19  4158    History   Chief Complaint Chief Complaint  Patient presents with  . Chest Pain    HPI Joseph Hendrix is a 33 y.o. male with no significant past medical history presenting with intermittent middle chest pain onset 2 weeks ago. Patient reports chest pain began after lifting heavy boxes. Patient describes chest pain as a pulling sensation. Patient states it is non radiating. Patient reports chest pain is worse with stretching and resolves on its own. Patient denies a personal or family history of heart problems. Patient denies tobacco, alcohol, or drug use. Patient states he has taken tylenol with partial relief. Denies DOE, SOB, chest tightness or pressure, radiation to left arm, jaw or back, nausea, or diaphoresis. Patient denies recent travel, recent surgery, leg edema/pain, or hx of DVT/PE.  Patient also reports constipation for 3 weeks. Patient states he has a history of constipation in the past. Patient states last bowel movement was 1 week ago. Patient denies abdominal pain, nausea, or vomiting. Patient states he is passing gas. Patient states he took a laxative 2 weeks ago. Patient denies using any other medications/treatment.   Patient also reports a mild sore throat for 2 weeks. Patient states he has tried tea with relief. Patient denies fever, chills, cough, congestion, or rhinorrhea. Patient reports sick exposure by friend. Patient denies neck pain, trouble swallowing, voice change, or difficulty breathing.    HPI  History reviewed. No pertinent past medical history.  There are no active problems to display for this patient.   History reviewed. No pertinent surgical history.      Home Medications    Prior to Admission medications   Medication Sig Start Date End Date Taking? Authorizing Provider  bismuth subsalicylate (PEPTO BISMOL) 262 MG/15ML  suspension Take 30 mLs by mouth every 6 (six) hours as needed for diarrhea or loose stools.    [provider]  docusate sodium (COLACE) 250 MG capsule Take 1 capsule (250 mg total) by mouth daily. 01/28/19   Carlyle Basques P, PA-C  loperamide (IMODIUM) 2 MG capsule Take 4 mg by mouth as needed for diarrhea or loose stools.    [provider]    Family History Family History  Problem Relation Age of Onset  . Diabetes Mother   . Hyperlipidemia Mother   . Diabetes Father   . Hyperlipidemia Father   . Diabetes Brother   . Hyperlipidemia Brother     Social History Social History   Tobacco Use  . Smoking status: Never Smoker  . Smokeless tobacco: Never Used  Substance Use Topics  . Alcohol use: No  . Drug use: No     Allergies   Patient has no known allergies.   Review of Systems Review of Systems  Constitutional: Negative for activity change, appetite change, chills, diaphoresis, fatigue, fever and unexpected weight change.  HENT: Positive for sore throat. Negative for congestion, dental problem, ear pain, facial swelling, rhinorrhea, sinus pressure, sinus pain, trouble swallowing and voice change.   Respiratory: Negative for cough, chest tightness, shortness of breath and wheezing.   Cardiovascular: Positive for chest pain. Negative for palpitations and leg swelling.  Gastrointestinal: Positive for constipation. Negative for abdominal distention, abdominal pain, diarrhea, nausea and vomiting.  Endocrine: Negative for cold intolerance and heat intolerance.  Genitourinary: Negative for dysuria.  Musculoskeletal: Negative for back pain.  Skin: Negative for rash.  Allergic/Immunologic: Negative  for immunocompromised state.  Neurological: Negative for dizziness, syncope, weakness and light-headedness.  Psychiatric/Behavioral: Negative for agitation and behavioral problems. The patient is not nervous/anxious.      Physical Exam Updated Vital Signs BP 133/86  (BP Location: Right Arm)   Pulse 89   Temp 98.2 F (36.8 C) (Oral)   Resp 17   Ht 6\' 2"  (1.88 m)   Wt 106.1 kg   SpO2 98%   BMI 30.04 kg/m   Physical Exam Vitals signs and nursing note reviewed.  Constitutional:      General: He is not in acute distress.    Appearance: He is well-developed. He is not diaphoretic.  HENT:     Head: Normocephalic and atraumatic.     Right Ear: Hearing, tympanic membrane, ear canal and external ear normal.     Left Ear: Hearing, tympanic membrane, ear canal and external ear normal.     Nose: Mucosal edema present. No congestion or rhinorrhea.     Mouth/Throat:     Pharynx: Uvula midline. No pharyngeal swelling, oropharyngeal exudate, posterior oropharyngeal erythema or uvula swelling.     Tonsils: No tonsillar exudate or tonsillar abscesses. Swelling: 1+ on the right. 0 on the left.  Eyes:     Extraocular Movements: Extraocular movements intact.     Pupils: Pupils are equal, round, and reactive to light.  Neck:     Musculoskeletal: Normal range of motion and neck supple.     Vascular: No JVD.  Cardiovascular:     Rate and Rhythm: Normal rate and regular rhythm.     Pulses: Normal pulses.          Radial pulses are 2+ on the right side and 2+ on the left side.       Dorsalis pedis pulses are 2+ on the right side and 2+ on the left side.     Heart sounds: Normal heart sounds. No murmur. No friction rub. No gallop.   Pulmonary:     Effort: Pulmonary effort is normal. No respiratory distress.     Breath sounds: Normal breath sounds. No decreased breath sounds, wheezing, rhonchi or rales.  Chest:     Chest wall: Tenderness present.  Abdominal:     Palpations: Abdomen is soft.     Tenderness: There is no abdominal tenderness. There is no right CVA tenderness, left CVA tenderness or guarding.  Musculoskeletal: Normal range of motion.     Right lower leg: He exhibits no tenderness. No edema.     Left lower leg: He exhibits no tenderness. No edema.    Skin:    General: Skin is warm.     Capillary Refill: Capillary refill takes less than 2 seconds.     Coloration: Skin is not pale.     Findings: No rash.  Neurological:     Mental Status: He is alert and oriented to person, place, and time.    ED Treatments / Results  Labs (all labs ordered are listed, but only abnormal results are displayed) Labs Reviewed - No data to display  EKG EKG Interpretation  Date/Time:  Wednesday January 28 2019 10:32:41 EST Ventricular Rate:  82 PR Interval:  144 QRS Duration: 76 QT Interval:  354 QTC Calculation: 413 R Axis:   30 Text Interpretation:  Normal sinus rhythm Normal ECG When compared to prior, no significant changes seen.  No STEMI Confirmed by Theda Belfast ([id number]) on 01/28/2019 10:57:27 AM   Radiology Dg Chest 2 View  Result Date: 01/28/2019  CLINICAL DATA:  Chest pain. EXAM: CHEST - 2 VIEW COMPARISON:  None. FINDINGS: The heart size and mediastinal contours are within normal limits. Both lungs are clear. The visualized skeletal structures are unremarkable. IMPRESSION: No active cardiopulmonary disease. Electronically Signed   By: Obie Dredge M.D.   On: 01/28/2019 10:20   Dg Abdomen 1 View  Result Date: 01/28/2019 CLINICAL DATA:  Constipation for the past 3 weeks. EXAM: ABDOMEN - 1 VIEW COMPARISON:  Abdominal x-ray dated August 28, 2018. FINDINGS: The bowel gas pattern is normal. Moderate colonic stool burden. No radio-opaque calculi or other significant radiographic abnormality are seen. No acute osseous abnormality. IMPRESSION: 1. No acute abnormality.  Moderate colonic stool burden. Electronically Signed   By: Obie Dredge M.D.   On: 01/28/2019 10:21    Procedures Procedures (including critical care time)  Medications Ordered in ED Medications - No data to display   Initial Impression / Assessment and Plan / ED Course  I have reviewed the triage vital signs and the nursing notes.  Pertinent labs & imaging results that  were available during my care of the patient were reviewed by me and considered in my medical decision making (see chart for details).  Clinical Course as of Jan 28 1103  Wed Jan 28, 2019  1033 No active cardiopulmonary disease.  DG Chest 2 View [AH]  1033 No acute abnormality.  Moderate colonic stool burden.  DG Abdomen 1 View [AH]    Clinical Course User Index [AH] Leretha Dykes, PA-C      Patient is to be discharged with recommendation to follow up with PCP in regards to today's hospital visit. Suspect chest pain is musculoskeletal based on history and physical exam. Chest pain is not likely of cardiac or pulmonary etiology d/t presentation, PERC negative, VSS, no tracheal deviation, no JVD or new murmur, RRR, breath sounds equal bilaterally, EKG without acute abnormalities, and negative CXR. Do not suspect a troponin is needed at this time. Pt has been advised to return to the ED if CP becomes exertional, associated with diaphoresis or nausea, radiates to left jaw/arm, worsens or becomes concerning in any way. Abdominal x ray does not reveal signs of obstruction. Will prescribe a stool softener for constipation and advised patient to drink plenty of fluids and eat foods higher in fiber. Suspect sore throat is likely viral in nature due to history and physical exam. Centor score is a 1 and recommends no further testing or antibiotics. Advised patient to follow up with PCP. Pt appears reliable for follow up and is agreeable to discharge.    Final Clinical Impressions(s) / ED Diagnoses   Final diagnoses:  Chest wall pain  Sore throat  Constipation, unspecified constipation type    ED Discharge Orders         Ordered    docusate sodium (COLACE) 250 MG capsule  Daily     01/28/19 1102           Carlyle Basques [place], [place] 01/28/19 1105    Tegeler, Canary Brim, MD 01/28/19 [phone]

## 2019-01-28 NOTE — ED Triage Notes (Signed)
Pt in c/o sore throat, constipation and cp onset at the same time x 2 weeks ago, no cardiac history,LBM x 1 wk ago, A&O x4,

## 2019-01-28 NOTE — Discharge Instructions (Addendum)
You have been seen today for chest pain, sore throat, and constipation. Please read and follow all provided instructions.   1. Medications: colace for constipation, tylenol/ibuprofen for chest pain/sore throat, usual home medications 2. Treatment: rest, drink plenty of fluids 3. Follow Up: Please follow up with your primary doctor in 2 days for discussion of your diagnoses and further evaluation after today's visit; if you do not have a primary care doctor use the resource guide provided to find one; Please return to the ER for any new or worsening symptoms. Please obtain all of your results from medical records or have your doctors office obtain the results - share them with your doctor - you should be seen at your doctors office. Call today to arrange your follow up.   Take medications as prescribed. Please review all of the medicines and only take them if you do not have an allergy to them. Return to the emergency room for worsening condition or new concerning symptoms. Follow up with your regular doctor. If you don't have a regular doctor use one of the numbers below to establish a primary care doctor.  Please be aware that if you are taking birth control pills, taking other prescriptions, ESPECIALLY ANTIBIOTICS may make the birth control ineffective - if this is the case, either do not engage in sexual activity or use alternative methods of birth control such as condoms until you have finished the medicine and your family doctor says it is OK to restart them. If you are on a blood thinner such as COUMADIN, be aware that any other medicine that you take may cause the coumadin to either work too much, or not enough - you should have your coumadin level rechecked in next 7 days if this is the case.  ?  It is also a possibility that you have an allergic reaction to any of the medicines that you have been prescribed - Everybody reacts differently to medications and while MOST people have no trouble with  most medicines, you may have a reaction such as nausea, vomiting, rash, swelling, shortness of breath. If this is the case, please stop taking the medicine immediately and contact your physician.  ?  You should return to the ER if you develop severe or worsening symptoms.   Emergency Department Resource Guide 1) Find a Doctor and Pay Out of Pocket Although you won't have to find out who is covered by your insurance plan, it is a good idea to ask around and get recommendations. You will then need to call the office and see if the doctor you have chosen will accept you as a new patient and what types of options they offer for patients who are self-pay. Some doctors offer discounts or will set up payment plans for their patients who do not have insurance, but you will need to ask so you aren't surprised when you get to your appointment.  2) Contact Your Local Health Department Not all health departments have doctors that can see patients for sick visits, but many do, so it is worth a call to see if yours does. If you don't know where your local health department is, you can check in your phone book. The CDC also has a tool to help you locate your state's health department, and many state websites also have listings of all of their local health departments.  3) Find a Walk-in Clinic If your illness is not likely to be very severe or complicated, you may want  to try a walk in clinic. These are popping up all over the country in pharmacies, drugstores, and shopping centers. They're usually staffed by nurse practitioners or physician assistants that have been trained to treat common illnesses and complaints. They're usually fairly quick and inexpensive. However, if you have serious medical issues or chronic medical problems, these are probably not your best option.  No Primary Care Doctor: Call Health Connect at  [phone] - they can help you locate a primary care doctor that  accepts your insurance, provides  certain services, etc. Physician Referral [phone]  Emergency Department Resource Guide 1) Find a Doctor and Pay Out of Pocket Although you won't have to find out who is covered by your insurance plan, it is a good idea to ask around and get recommendations. You will then need to call the office and see if the doctor you have chosen will accept you as a new patient and what types of options they offer for patients who are self-pay. Some doctors offer discounts or will set up payment plans for their patients who do not have insurance, but you will need to ask so you aren't surprised when you get to your appointment.  2) Contact Your Local Health Department Not all health departments have doctors that can see patients for sick visits, but many do, so it is worth a call to see if yours does. If you don't know where your local health department is, you can check in your phone book. The CDC also has a tool to help you locate your state's health department, and many state websites also have listings of all of their local health departments.  3) Find a [facility] Clinic If your illness is not likely to be very severe or complicated, you may want to try a walk in clinic. These are popping up all over the country in pharmacies, drugstores, and shopping centers. They're usually staffed by nurse practitioners or physician assistants that have been trained to treat common illnesses and complaints. They're usually fairly quick and inexpensive. However, if you have serious medical issues or chronic medical problems, these are probably not your best option.  No Primary Care Doctor: Call Health Connect at  [phone] - they can help you locate a primary care doctor that  accepts your insurance, provides certain services, etc. Physician Referral Service- [phone]  Chronic Pain Problems: Organization         Address  Phone   Notes  [facility]  [phone] Patients need to be  referred by their primary care doctor.   Medication Assistance: Organization         Address  Phone   Notes  [facility] [address] [place], [place] [address] [phone] --Must be a resident of [facility] -- Must have NO insurance coverage whatsoever (no Medicaid/ Medicare, etc.) -- The pt. MUST have a primary care doctor that directs their care regularly and follows them in the community   [facility]  [phone]   [employer]  [phone]    Agencies that provide inexpensive medical care: Organization         Address  Phone   Notes  [place]  [phone]   [facility]    [phone]   [facility] [place], [place] [address] [phone]   [place] [address] [place] [phone]  [facility]    [phone]   [facility]    [phone]   [facility] [place] [address], [place] Phone:  [phone], Fax:  [phone] Hours of Operation:  9 am - 6 pm, M-F.  Also accepts Medicaid/Medicare and self-pay.  [facility] for [place] [address], [address], [place] Phone: [phone], Fax: [phone]. Hours of Operation:  8:30 am - 5:30 pm, M-F.  Also accepts Medicaid and self-pay.  [facility] [address], [place] Phone: [phone]   [place], [place], [place] [phone], Ext. [phone] Mondays & Thursdays: 7-9 AM.  First 15 patients are seen on a first come, first serve basis.    [facility]:  Organization         Address  Phone   Notes  [facility] [address],  [phone] Also accepts self-pay patients.  [facility] [address], [place]  [phone]   [place], [address], [place] [phone]   [facility] [address], [place] [phone]   Lucianne Lei [address], [place]   [phone] Only accepts [place] [facility] [place] patients after they have their name applied to their card.   Self-Pay (no insurance) in [facility]:  Organization         Address  Phone   Notes  Sickle Cell Patients, [facility] [place] [phone]   [facility] [place] [phone]   [facility] [place]  [address], [place], [place] [phone]   [facility]/Dr. Osei-Bonsu  [address], [place] or [address], [place] [phone] Phone number for both [place] and [place] locations is the same.  [facility] and [facility] [address], [place] [phone]   [facility] [address], [place] or [address] [phone]   [facility] [address] [place] [phone], phone; [phone], fax Sees patients 1st and 3rd Saturday of every month.  Must not qualify for public or private insurance (i.e. Medicaid, Medicare,  Health Choice, Veterans' Benefits)  Household income should be no more than 200% of the poverty level The clinic cannot treat you if you are pregnant or think you are pregnant  Sexually transmitted diseases are not treated at the clinic.

## 2019-03-27 DIAGNOSIS — R0789 Other chest pain: Secondary | ICD-10-CM | POA: Insufficient documentation

## 2019-03-27 DIAGNOSIS — K5900 Constipation, unspecified: Secondary | ICD-10-CM | POA: Insufficient documentation

## 2019-03-27 HISTORY — DX: Constipation, unspecified: K59.00

## 2019-03-27 HISTORY — DX: Other chest pain: R07.89

## 2019-06-27 ENCOUNTER — Other Ambulatory Visit: Payer: Self-pay

## 2019-06-27 ENCOUNTER — Emergency Department (HOSPITAL_COMMUNITY)
Admission: EM | Admit: 2019-06-27 | Discharge: 2019-06-27 | Disposition: A | Discharge status: Home / Self Care | Payer: Medicare Other | Attending: Emergency Medicine | Admitting: Emergency Medicine

## 2019-06-27 ENCOUNTER — Emergency Department (HOSPITAL_COMMUNITY): Payer: Medicare Other

## 2019-06-27 ENCOUNTER — Encounter (HOSPITAL_COMMUNITY): Payer: Self-pay

## 2019-06-27 DIAGNOSIS — R1032 Left lower quadrant pain: Secondary | ICD-10-CM | POA: Diagnosis not present

## 2019-06-27 DIAGNOSIS — Z79899 Other long term (current) drug therapy: Secondary | ICD-10-CM | POA: Insufficient documentation

## 2019-06-27 DIAGNOSIS — K921 Melena: Secondary | ICD-10-CM | POA: Insufficient documentation

## 2019-06-27 DIAGNOSIS — K5900 Constipation, unspecified: Secondary | ICD-10-CM | POA: Diagnosis not present

## 2019-06-27 DIAGNOSIS — R197 Diarrhea, unspecified: Secondary | ICD-10-CM | POA: Diagnosis not present

## 2019-06-27 DIAGNOSIS — R079 Chest pain, unspecified: Secondary | ICD-10-CM

## 2019-06-27 DIAGNOSIS — R0789 Other chest pain: Secondary | ICD-10-CM | POA: Insufficient documentation

## 2019-06-27 LAB — BASIC METABOLIC PANEL
Anion gap: 7 (ref 5–15)
BUN: 11 mg/dL (ref 6–20)
CO2: 25 mmol/L (ref 22–32)
Calcium: 8.9 mg/dL (ref 8.9–10.3)
Chloride: 106 mmol/L (ref 98–111)
Creatinine, Ser: 0.95 mg/dL (ref 0.61–1.24)
GFR calc Af Amer: >60 mL/min (ref >60)
GFR calc non Af Amer: >60 mL/min (ref >60)
Glucose, Bld: 103 mg/dL — ABNORMAL HIGH (ref 70–99)
Potassium: 4.2 mmol/L (ref 3.5–5.1)
Sodium: 138 mmol/L (ref 135–145)

## 2019-06-27 LAB — CBC
HCT: 46.2 % (ref 39.0–52.0)
Hemoglobin: 14.8 g/dL (ref 13.0–17.0)
MCH: 28.2 pg (ref 26.0–34.0)
MCHC: 32 g/dL (ref 30.0–36.0)
MCV: 88.2 fL (ref 80.0–100.0)
Platelets: 243 10*3/uL (ref 150–400)
RBC: 5.24 MIL/uL (ref 4.22–5.81)
RDW: 12.8 % (ref 11.5–15.5)
WBC: 5.4 10*3/uL (ref 4.0–10.5)
nRBC: 0 % (ref 0.0–0.2)

## 2019-06-27 LAB — TROPONIN I (HIGH SENSITIVITY): Troponin I (High Sensitivity): <2 ng/L (ref <18)

## 2019-06-27 NOTE — ED Provider Notes (Signed)
[facility] Provider Note   CSN: [id number] Arrival date & time: 06/27/19  [id number]    History   Chief Complaint Chief Complaint  Patient presents with  . Chest Pain    HPI Joseph Hendrix is a 33 y.o. male.     HPI  33 y/o male - with one week onset of chest pain and abdominal pain - comes on hourly - lasts 10 minutes - spontaneous, non exertional - goes to L neck and arm - not worse with movement - Lower left abd pain with it - has had some constipation / loose stools / some blood in stools in last 24 hours - no heart diease, no RF - for heart disease -    History reviewed. No pertinent past medical history.  There are no active problems to display for this patient.   History reviewed. No pertinent surgical history.      Home Medications    Prior to Admission medications   Medication Sig Start Date End Date Taking? Authorizing Provider  bismuth subsalicylate (PEPTO BISMOL) 262 MG/15ML suspension Take 30 mLs by mouth every 6 (six) hours as needed for diarrhea or loose stools.    [provider]  docusate sodium (COLACE) 250 MG capsule Take 1 capsule (250 mg total) by mouth daily. 01/28/19   Carlyle BasquesHernandez, Ana P, PA-C  loperamide (IMODIUM) 2 MG capsule Take 4 mg by mouth as needed for diarrhea or loose stools.    [provider]    Family History Family History  Problem Relation Age of Onset  . Diabetes Mother   . Hyperlipidemia Mother   . Diabetes Father   . Hyperlipidemia Father   . Diabetes Brother   . Hyperlipidemia Brother     Social History Social History   Tobacco Use  . Smoking status: Never Smoker  . Smokeless tobacco: Never Used  Substance Use Topics  . Alcohol use: No  . Drug use: No     Allergies   Patient has no known allergies.   Review of Systems Review of Systems  All other systems reviewed and are negative.    Physical Exam Updated Vital Signs BP 136/88 (BP Location: Right Arm)    Pulse 87   Temp 98.9 F (37.2 C) (Oral)   Resp (!) 21   Ht 1.854 m (6\' 1" )   Wt 108.9 kg   SpO2 99%   BMI 31.66 kg/m   Physical Exam Vitals signs and nursing note reviewed.  Constitutional:      General: He is not in acute distress.    Appearance: He is well-developed.  HENT:     Head: Normocephalic and atraumatic.     Mouth/Throat:     Pharynx: No oropharyngeal exudate.  Eyes:     General: No scleral icterus.       Right eye: No discharge.        Left eye: No discharge.     Conjunctiva/sclera: Conjunctivae normal.     Pupils: Pupils are equal, round, and reactive to light.  Neck:     Musculoskeletal: Normal range of motion and neck supple.     Thyroid: No thyromegaly.     Vascular: No JVD.  Cardiovascular:     Rate and Rhythm: Normal rate and regular rhythm.     Heart sounds: Normal heart sounds. No murmur. No friction rub. No gallop.   Pulmonary:     Effort: Pulmonary effort is normal. No respiratory distress.  Breath sounds: Normal breath sounds. No wheezing or rales.  Chest:     Chest wall: No tenderness.  Abdominal:     General: Bowel sounds are normal. There is no distension.     Palpations: Abdomen is soft. There is no mass.     Tenderness: There is abdominal tenderness ( mild LLQ ttp).  Musculoskeletal: Normal range of motion.        General: No tenderness.  Lymphadenopathy:     Cervical: No cervical adenopathy.  Skin:    General: Skin is warm and dry.     Findings: No erythema or rash.  Neurological:     Mental Status: He is alert.     Coordination: Coordination normal.     Comments: Clear speech, normal strength  Psychiatric:        Behavior: Behavior normal.      ED Treatments / Results  Labs (all labs ordered are listed, but only abnormal results are displayed) Labs Reviewed  BASIC METABOLIC PANEL - Abnormal; Notable for the following components:      Result Value   Glucose, Bld 103 (*)    All other components within normal limits   CBC  TROPONIN I (HIGH SENSITIVITY)    EKG EKG Interpretation  Date/Time:  Saturday June 27 2019 09:34:15 EDT Ventricular Rate:  86 PR Interval:    QRS Duration: 79 QT Interval:  344 QTC Calculation: 412 R Axis:   14 Text Interpretation:  Sinus rhythm Borderline T wave abnormalities since last tracing no significant change Confirmed by Noemi Chapel [phone]) on 06/27/2019 9:45:35 AM   Radiology Dg Chest 2 View  Result Date: 06/27/2019 CLINICAL DATA:  Shortness of breath and mid chest pain. EXAM: CHEST - 2 VIEW COMPARISON:  January 28, 2019 FINDINGS: The heart size and mediastinal contours are within normal limits. Both lungs are clear. The visualized skeletal structures are unremarkable. IMPRESSION: No active cardiopulmonary disease. Electronically Signed   By: Abelardo Diesel M.D.   On: 06/27/2019 10:16    Procedures Procedures (including critical care time)  Medications Ordered in ED Medications - No data to display   Initial Impression / Assessment and Plan / ED Course  I have reviewed the triage vital signs and the nursing notes.  Pertinent labs & imaging results that were available during my care of the patient were reviewed by me and considered in my medical decision making (see chart for details).       This patient is well-appearing, has an undetectable troponin, CBC is normal and the metabolic panel is normal.  The chest x-ray is totally unremarkable without any signs of cardiopulmonary abnormalities and the EKG is also unremarkable.  The patient is not very active but at the same time has essentially normal vital signs and an EKG which is nonischemic.  At this time I think the patient can be discharged to follow-up in the outpatient setting, he will be given follow-up resources including cardiac clinic.  Patient agreeable.   Final Clinical Impressions(s) / ED Diagnoses   Final diagnoses:  Left-sided chest pain    ED Discharge Orders    None       Noemi Chapel,  MD 06/27/19 1029

## 2019-06-27 NOTE — Discharge Instructions (Signed)
Take a daily baby aspirin until you see the cardiologist Call for appointment on Monday morning ER for evaluation for worsening pain / difficulty breathing

## 2019-06-27 NOTE — ED Triage Notes (Signed)
Pt states 3/10 pressure on left side of chest that radiates down left arm, left jaw pain, abdominal painx1wk. Pt states he's been having diapheresis. Pt is A&Ox4. NSR on monitor, VS WNL. Pt doesn't appear to be in any distress.

## 2019-06-30 ENCOUNTER — Ambulatory Visit: Payer: Medicare Other | Admitting: Cardiology

## 2019-07-07 NOTE — Progress Notes (Signed)
Cardiology Office Note:    Date:  07/08/2019   ID:  Joseph Hendrix, DOB 1986/02/23, MRN [id number]  PCP:  Bailey MechPodraza, Cole Christopher, PA-C  Cardiologist:  Norman HerrlichBrian , MD   Referring MD: No ref. provider found  ASSESSMENT:    1. Chest pain at rest   2. Elevated BP without diagnosis of hypertension    PLAN:    In order of problems listed above:  1. 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 2. BP is not elevated to the range that initiate therapy at this time.  Next appointment 2weeks   Medication Adjustments/Labs and Tests Ordered: Current medicines are reviewed at length with the patient today.  Concerns regarding medicines are outlined above.  No orders of the defined types were placed in this encounter.  No orders of the defined types were placed in this encounter.    Chief Complaint  Patient presents with  . Follow-up  . Chest Pain    History of Present Illness:    Joseph Hendrix is a 33 y.o. male who is being seen today for the evaluation of chest pain after recent ED visit. [facility] 06/30/2019 for chest pain with normal chest x-ray labs including troponin and unremarkable EKG.Most recent encounter included abdominal pain as well as chest pain.   He is a non-smoker.  Family history notable for heart disease in his mother, father, brother.  He continues to have chest discomfort intermittently it is sharp localized in the left chest initially he had a cough its resolved and he just did not feel well he had no fever or chills it was not positional did not radiate into the shoulder and neck.  An extensive evaluation in the emergency room including labs EKGs troponins and chest x-ray all of which were normal.  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  I suspect he will prompt quick recovery and will see back in the office in 6 weeks in follow-up his sedimentation rate is significantly elevated will require additional testing for rheumatologic disease and consideration of more intensive treatment even including colchicine and higher dose prescription nonsteroidal anti-inflammatory drug.  He has no identifiable risk factors for heart disease no history of chest wall trauma.  Past Medical History:  Diagnosis Date  . Constipation 03/27/2019  . Elevated BP without diagnosis of hypertension 08/09/2016  . Gastroesophageal reflux disease without esophagitis 09/08/2016  . Musculoskeletal chest pain 03/27/2019    Past Surgical History:  Procedure Laterality Date  . NO PAST SURGERIES      Current Medications: Current Meds  Medication Sig  . bismuth subsalicylate (PEPTO BISMOL) 262 MG/15ML suspension Take 30 mLs by mouth every 6 (six) hours as needed for diarrhea or loose stools.  . docusate sodium (COLACE) 250 MG capsule Take 1 capsule (250 mg total) by mouth daily.  Marland Kitchen. loperamide (IMODIUM) 2 MG capsule Take 4 mg by mouth as needed for diarrhea or loose stools.  Marland Kitchen. omeprazole (PRILOSEC) 20 MG capsule TK 1 C PO ONCE D     Allergies:   Patient has no known allergies.   Social History   Socioeconomic History  . Marital status: Single    Spouse name: Not on file  .  Number of children: Not on file  . Years of education: Not on file  . Highest education level: Not on file  Occupational History  . Not on file  Social Needs  . Financial resource strain: Not on file  . Food insecurity    Worry: Not on file    Inability: Not on file  . Transportation needs    Medical: Not on file    Non-medical: Not on file  Tobacco Use  . Smoking status: Never Smoker  . Smokeless tobacco: Never Used  Substance and Sexual  Activity  . Alcohol use: No  . Drug use: No  . Sexual activity: Never  Lifestyle  . Physical activity    Days per week: Not on file    Minutes per session: Not on file  . Stress: Not on file  Relationships  . Social Herbalist on phone: Not on file    Gets together: Not on file    Attends religious service: Not on file    Active member of club or organization: Not on file    Attends meetings of clubs or organizations: Not on file    Relationship status: Not on file  Other Topics Concern  . Not on file  Social History Narrative  . Not on file     Family History: The patient's family history includes Diabetes in his brother, father, and mother; Hyperlipidemia in his brother, father, and mother; Kidney failure in his mother. There is no history of Heart attack.  ROS:   Review of Systems  Constitution: Positive for malaise/fatigue.  HENT: Negative.   Eyes: Negative.   Cardiovascular: Positive for chest pain.  Respiratory: Positive for cough.   Endocrine: Negative.   Hematologic/Lymphatic: Negative.   Skin: Negative.   Musculoskeletal: Negative.   Gastrointestinal: Negative.   Genitourinary: Negative.   Neurological: Negative.   Psychiatric/Behavioral: Negative.   Allergic/Immunologic: Negative.    Please see the history of present illness.     All other systems reviewed and are negative.  EKGs/Labs/Other Studies Reviewed:    The following studies were reviewed today:   Recent Labs: 06/27/2019: BUN 11; Creatinine, Ser 0.95; Hemoglobin 14.8; Platelets 243; Potassium 4.2; Sodium 138  Recent Lipid Panel No results found for: CHOL, TRIG, HDL, CHOLHDL, VLDL, LDLCALC, LDLDIRECT  Physical Exam:    VS:  Pulse 88   Ht 6\' 1"  (1.854 m)   Wt 245 lb 1.9 oz (111.2 kg)   SpO2 98%   BMI 32.34 kg/m     Wt Readings from Last 3 Encounters:  07/08/19 245 lb 1.9 oz (111.2 kg)  06/27/19 240 lb (108.9 kg)  01/28/19 234 lb (106.1 kg)     GEN:  Well nourished, well  developed in no acute distress HEENT: Normal NECK: No JVD; No carotid bruits LYMPHATICS: No lymphadenopathy CARDIAC: RRR, no murmurs, rubs, gallops RESPIRATORY:  Clear to auscultation without rales, wheezing or rhonchi  ABDOMEN: Soft, non-tender, non-distended MUSCULOSKELETAL:  No edema; No deformity  SKIN: Warm and dry NEUROLOGIC:  Alert and oriented x 3 PSYCHIATRIC:  Normal affect     Signed, Shirlee More, MD  07/08/2019 10:33 AM    [place]

## 2019-07-08 ENCOUNTER — Ambulatory Visit (INDEPENDENT_AMBULATORY_CARE_PROVIDER_SITE_OTHER): Payer: Medicare Other | Admitting: Cardiology

## 2019-07-08 ENCOUNTER — Encounter: Payer: Self-pay | Admitting: Cardiology

## 2019-07-08 ENCOUNTER — Other Ambulatory Visit: Payer: Self-pay

## 2019-07-08 VITALS — BP 122/85 | HR 88 | Ht 73.0 in | Wt 245.1 lb

## 2019-07-08 DIAGNOSIS — R030 Elevated blood-pressure reading, without diagnosis of hypertension: Secondary | ICD-10-CM

## 2019-07-08 DIAGNOSIS — R079 Chest pain, unspecified: Secondary | ICD-10-CM | POA: Insufficient documentation

## 2019-07-08 MED ORDER — IBUPROFEN 200 MG PO TABS: 400.0000 mg | ORAL_TABLET | Freq: Three times a day (TID) | ORAL | 0 refills | Status: DC

## 2019-07-08 NOTE — Patient Instructions (Addendum)
Medication Instructions:   Your physician has recommended you make the following change in your medication:   START Ibuprofen 400mg  by mouth three times per day with food for 2 weeks  If you need a refill on your cardiac medications before your next appointment, please call your pharmacy.   Lab work: Your physician recommends that you have lab work today: Sedimentation Rate  If you have labs (blood work) drawn today and your tests are completely normal, you will receive your results only by: Marland Kitchen MyChart Message (if you have MyChart) OR . A paper copy in the mail If you have any lab test that is abnormal or we need to change your treatment, we will call you to review the results.  Testing/Procedures: None ordered today.  Follow-Up: At [facility], you and your health needs are our priority.  As part of our continuing mission to provide you with exceptional heart care, we have created designated Provider Care Teams.  These Care Teams include your primary Cardiologist (physician) and Advanced Practice Providers (APPs -  Physician Assistants and Nurse Practitioners) who all work together to provide you with the care you need, when you need it. You will need a follow up appointment in 6 weeks   Any Other Special Instructions Will Be Listed Below (If Applicable).   Pleurisy  Pleurisy, also called pleuritis, is irritation and swelling (inflammation) of the linings of the lungs. The linings of the lungs are called pleura. They cover the outside of the lungs and the inside of the chest wall. There is a small amount of fluid (pleural fluid) between the pleura that allows the lungs to move in and out smoothly when you breathe. Pleurisy causes the pleura to be rough and dry and to rub together when you breathe, which is painful. In some cases, pleurisy can cause pleural fluid to build up between the pleura (pleural effusion). What are the causes? Common causes of this condition include:  A lung  infection caused by bacteria or a virus.  A blood clot that travels to the lung (pulmonary embolism).  Air leaking into the pleural space (pneumothorax).  Lung cancer or a lung tumor.  A chest injury.  Diseases that can cause lung inflammation. These include rheumatoid arthritis, lupus, sickle cell disease, inflammatory bowel disease, and pancreatitis.  Heart or chest surgery.  Lung damage from inhaling asbestos.  A lung reaction to certain medicines. Sometimes the cause is unknown.  What are the signs or symptoms? Chest pain is the main symptom of this condition. The pain is usually on one side. Chest pain may start suddenly and be sharp or stabbing. It may become a constant dull ache. You may also feel pain in your back or shoulder. The pain may get worse when you cough, take deep breaths, or make sudden movements. Other symptoms may include:  Shortness of breath.  Noisy breathing (wheezing).  Cough.  Chills.  Fever.  How is this diagnosed? This condition may be diagnosed based on:  Your medical history.  Your symptoms.  A physical exam. Your health care provider will listen to your breathing with a stethoscope to check for a rough, rubbing sound (friction rub). If you have pleural effusion, your breathing sounds may be muffled.  Tests, such as: ? Blood tests to check for infections or diseases and to measure the oxygen in your blood. ? Imaging studies of your lungs. These may include a chest X-ray, ultrasound, MRI, or CT scan. ? A procedure to remove  pleural fluid with a needle for testing (thoracentesis). ?  How is this treated? Treatment for this condition depends on the cause. Pleurisy that was caused by a virus usually clears up within 2 weeks. Treatment for pleurisy may include:  NSAIDs to help relieve pain and swelling.  Antibiotic medicines, if your condition was caused by a bacterial infection.  Prescription pain or cough medicine.  Medicines to  dissolve a blood clot, if your condition was caused by pulmonary embolism.  Removal of pleural fluid or air.   Follow these instructions at home: Medicines  Take over-the-counter and prescription medicines only as told by your health care provider.  If you were prescribed an antibiotic, take it as told by your health care provider. Do not stop taking the antibiotic even if you start to feel better. Activity  Rest and return to your normal activities as told by your health care provider. Ask your health care provider what activities are safe for you.  Do not drive or use heavy machinery while taking prescription pain medicine. General instructions   Monitor your pleurisy for any changes.  Take deep breaths often, even if it is painful. This can help prevent lung infection (pneumonia) and collapse of lung tissue (atelectasis).  When lying down, lie on your painful side. This may reduce pain.  Do not smoke. If you need help quitting, ask your health care provider.  Keep all follow-up visits as told by your health care provider. This is important. Contact a health care provider if:  You have pain that: ? Gets worse. ? Does not get better with medicine. ? Lasts for more than 1 week.  You have a fever or chills.  Your cough or shortness of breath is not improving at home.  You cough up pus-like (purulent) secretions. Get help right away if:  Your lips, fingernails, or toenails darken or turn blue.  You cough up blood.  You have any of the following symptoms that get worse: ? Difficulty breathing. ? Shortness of breath. ? Wheezing.  You have pain that spreads into your neck, arms, or jaw.  You develop a rash.  You vomit.  You faint. Summary  Pleurisy is inflammation of the linings of the lungs (pleura).  Pleurisy causes pain that makes it difficult for you to breathe or cough.  Pleurisy is often caused by an underlying infection or disease.  Treatment of  pleurisy depends on the cause, and it often includes medicines. This information is not intended to replace advice given to you by your health care provider. Make sure you discuss any questions you have with your health care provider. Document Released: 11/12/2005 Document Revised: 10/25/2017 Document Reviewed: 08/06/2016 Elsevier Patient Education  2020 [employer]

## 2019-07-09 LAB — SEDIMENTATION RATE: Sed Rate: 30 mm/hr — ABNORMAL HIGH (ref 0–15)

## 2019-07-10 ENCOUNTER — Telehealth: Payer: Self-pay | Admitting: Cardiology

## 2019-07-13 ENCOUNTER — Telehealth: Payer: Self-pay

## 2019-07-13 DIAGNOSIS — R079 Chest pain, unspecified: Secondary | ICD-10-CM

## 2019-07-13 MED ORDER — IBUPROFEN 200 MG PO TABS: 600.0000 mg | ORAL_TABLET | Freq: Three times a day (TID) | ORAL | 0 refills | Status: AC

## 2019-07-13 NOTE — Telephone Encounter (Signed)
Patient notified to increase ibuprofen to 600mg  three times daily with food.  Patient agreed to plan and verbalized understanding.

## 2019-08-16 ENCOUNTER — Encounter (HOSPITAL_COMMUNITY): Payer: Self-pay | Admitting: Emergency Medicine

## 2019-08-16 ENCOUNTER — Other Ambulatory Visit: Payer: Self-pay

## 2019-08-16 ENCOUNTER — Emergency Department (HOSPITAL_COMMUNITY): Payer: Medicare Other

## 2019-08-16 ENCOUNTER — Emergency Department (HOSPITAL_COMMUNITY)
Admission: EM | Admit: 2019-08-16 | Discharge: 2019-08-16 | Disposition: A | Discharge status: Home / Self Care | Payer: Medicare Other | Attending: Emergency Medicine | Admitting: Emergency Medicine

## 2019-08-16 DIAGNOSIS — R079 Chest pain, unspecified: Secondary | ICD-10-CM | POA: Insufficient documentation

## 2019-08-16 DIAGNOSIS — Z79899 Other long term (current) drug therapy: Secondary | ICD-10-CM | POA: Insufficient documentation

## 2019-08-16 DIAGNOSIS — M7918 Myalgia, other site: Secondary | ICD-10-CM | POA: Diagnosis present

## 2019-08-16 DIAGNOSIS — R52 Pain, unspecified: Secondary | ICD-10-CM

## 2019-08-16 LAB — I-STAT CHEM 8, ED
BUN: 14 mg/dL (ref 6–20)
Calcium, Ion: 1.12 mmol/L — ABNORMAL LOW (ref 1.15–1.40)
Chloride: 101 mmol/L (ref 98–111)
Creatinine, Ser: 0.9 mg/dL (ref 0.61–1.24)
Glucose, Bld: 102 mg/dL — ABNORMAL HIGH (ref 70–99)
HCT: 46 % (ref 39.0–52.0)
Hemoglobin: 15.6 g/dL (ref 13.0–17.0)
Potassium: 4 mmol/L (ref 3.5–5.1)
Sodium: 140 mmol/L (ref 135–145)
TCO2: 29 mmol/L (ref 22–32)

## 2019-08-16 LAB — TROPONIN I (HIGH SENSITIVITY): Troponin I (High Sensitivity): <2 ng/L (ref <18)

## 2019-08-16 MED ORDER — NAPROXEN 500 MG PO TABS: 500.0000 mg | ORAL_TABLET | Freq: Two times a day (BID) | ORAL | 0 refills | Status: DC

## 2019-08-16 NOTE — ED Triage Notes (Signed)
Pt c/o generalized body aches x 1 day. Denies cough/fever.

## 2019-08-16 NOTE — Discharge Instructions (Signed)
You were seen in the emergency department today for pain to your entire body as well as chest pain.  Your chest x-ray was normal.  Your labs were reassuring.  We are sending home with naproxen.  - Naproxen is a nonsteroidal anti-inflammatory medication that will help with pain and swelling. Be sure to take this medication as prescribed with food, 1 pill every 12 hours,  It should be taken with food, as it can cause stomach upset, and more seriously, stomach bleeding. Do not take other nonsteroidal anti-inflammatory medications with this such as Advil, Motrin, Aleve, Mobic, Goodie Powder, or Motrin.   You make take Tylenol per over the counter dosing with these medications.   We have prescribed you new medication(s) today. Discuss the medications prescribed today with your pharmacist as they can have adverse effects and interactions with your other medicines including over the counter and prescribed medications. Seek medical evaluation if you start to experience new or abnormal symptoms after taking one of these medicines, seek care immediately if you start to experience difficulty breathing, feeling of your throat closing, facial swelling, or rash as these could be indications of a more serious allergic reaction    Please ensure to follow-up with your primary care provider within 3 days for recheck of your symptoms, please also follow-up with cardiology within 3 days to have your heart ultrasound scheduled.  Return to the ER for new or worsening symptoms including but not limited to persistent pain, worsening pain, trouble breathing, pain with exertion, fever, chills, rashes, vomiting, or any other concerns.

## 2019-08-16 NOTE — ED Provider Notes (Signed)
[facility] Provider Note   CSN: [id number] Arrival date & time: 08/16/19  0607     History   Chief Complaint Chief Complaint  Patient presents with  . Generalized Body Aches    HPI Candida PeelingBrandon Shuman is a 33 y.o. male with a hx of GERD, HTN, and constipation who presents to the ED with complaints of intermittent generalized body pains that began about 36 hours prior. Patient states he is having intermittent sharp/paresthetic well localized pains scattered throughout his entire body- head/trunk/extremities x4. No alleviating/aggravating factors or triggers that he can identified. He had a brief episode of dully chest pain yesterday AM but this has not re-occurred- not pleuritic or exertional. Has had mild gradual onset intermittent headache similar to prior. Denies fever, chills, URI sxs, cough, dyspnea, visual disturbance, numbness, weakness, dizziness, N/V/D, or syncope. Denies recent head injury. No new meds or dosing changes. Denies tic bites or rashes.      HPI  Past Medical History:  Diagnosis Date  . Constipation 03/27/2019  . Elevated BP without diagnosis of hypertension 08/09/2016  . Gastroesophageal reflux disease without esophagitis 09/08/2016  . Musculoskeletal chest pain 03/27/2019    Patient Active Problem List   Diagnosis Date Noted  . Chest pain at rest 07/08/2019  . Constipation 03/27/2019  . Musculoskeletal chest pain 03/27/2019  . Gastroesophageal reflux disease without esophagitis 09/08/2016  . Elevated BP without diagnosis of hypertension 08/09/2016    Past Surgical History:  Procedure Laterality Date  . NO PAST SURGERIES          Home Medications    Prior to Admission medications   Medication Sig Start Date End Date Taking? Authorizing Provider  bismuth subsalicylate (PEPTO BISMOL) 262 MG/15ML suspension Take 30 mLs by mouth every 6 (six) hours as needed for diarrhea or loose stools.    [provider]   docusate sodium (COLACE) 250 MG capsule Take 1 capsule (250 mg total) by mouth daily. 01/28/19   Carlyle BasquesHernandez, Ana P, PA-C  loperamide (IMODIUM) 2 MG capsule Take 4 mg by mouth as needed for diarrhea or loose stools.    [provider]  omeprazole (PRILOSEC) 20 MG capsule TK 1 C PO ONCE D 01/12/19   [provider]    Family History Family History  Problem Relation Age of Onset  . Diabetes Mother   . Hyperlipidemia Mother   . Kidney failure Mother   . Diabetes Father   . Hyperlipidemia Father   . Diabetes Brother   . Hyperlipidemia Brother   . Heart attack Neg Hx     Social History Social History   Tobacco Use  . Smoking status: Never Smoker  . Smokeless tobacco: Never Used  Substance Use Topics  . Alcohol use: No  . Drug use: No     Allergies   Patient has no known allergies.   Review of Systems Review of Systems  Constitutional: Negative for chills and fever.  HENT: Negative for congestion, ear pain and sore throat.   Respiratory: Negative for shortness of breath.   Cardiovascular: Positive for chest pain (brief episode yesterday AM has not re-occurred).  Gastrointestinal: Negative for abdominal pain, diarrhea, nausea and vomiting.  Musculoskeletal: Positive for myalgias.  Neurological: Negative for dizziness, syncope, weakness, numbness and headaches.  All other systems reviewed and are negative.   Physical Exam Updated Vital Signs BP 129/72 (BP Location: Right Arm)   Pulse 82   Temp 98.2 F (36.8 C) (Oral)  Resp (!) 22   SpO2 99%   Physical Exam Vitals signs and nursing note reviewed.  Constitutional:      General: He is not in acute distress.    Appearance: He is well-developed. He is not toxic-appearing.  HENT:     Head: Normocephalic and atraumatic.     Right Ear: Tympanic membrane and ear canal normal.     Left Ear: Tympanic membrane and ear canal normal.     Nose: Nose normal.     Mouth/Throat:     Mouth: Mucous membranes are  moist.     Pharynx: No oropharyngeal exudate or posterior oropharyngeal erythema.     Comments: Uvula midline.  Eyes:     General:        Right eye: No discharge.        Left eye: No discharge.     Extraocular Movements: Extraocular movements intact.     Conjunctiva/sclera: Conjunctivae normal.     Pupils: Pupils are equal, round, and reactive to light.  Neck:     Musculoskeletal: Neck supple.  Cardiovascular:     Rate and Rhythm: Normal rate and regular rhythm.     Comments: 2+ symmetric radial and PT pulses bilaterally. Pulmonary:     Effort: Pulmonary effort is normal. No respiratory distress.     Breath sounds: Normal breath sounds. No wheezing, rhonchi or rales.  Abdominal:     General: There is no distension.     Palpations: Abdomen is soft.     Tenderness: There is no abdominal tenderness. There is no guarding or rebound.  Musculoskeletal:     Comments: No obvious deformity, visual swelling, erythema, ecchymosis, or open wounds.  Normal intact active range of motion throughout the extremities point/focal bony tenderness.  Skin:    General: Skin is warm and dry.     Findings: No rash.  Neurological:     General: No focal deficit present.     Mental Status: He is alert.     Comments:  Alert. Clear speech. No facial droop. CNIII-XII grossly intact. Bilateral upper and lower extremities' sensation grossly intact. 5/5 symmetric strength with grip strength and with plantar and dorsi flexion bilaterally.  . Normal finger to nose bilaterally. Negative pronator drift.  Gait is steady and intact.    Psychiatric:        Behavior: Behavior normal.    ED Treatments / Results  Labs (all labs ordered are listed, but only abnormal results are displayed) Labs Reviewed  I-STAT CHEM 8, ED - Abnormal; Notable for the following components:      Result Value   Glucose, Bld 102 (*)    Calcium, Ion 1.12 (*)    All other components within normal limits  TROPONIN I (HIGH SENSITIVITY)     EKG EKG Interpretation  Date/Time:  Sunday August 16 2019 08:48:44 EDT Ventricular Rate:  78 PR Interval:  154 QRS Duration: 80 QT Interval:  360 QTC Calculation: 410 R Axis:   15 Text Interpretation:  Normal sinus rhythm nonspecific t wave abnormality  No ST segment changes no significant change compared to prior ecg Confirmed by Madalyn Rob [phone]) on 08/16/2019 10:41:30 AM   Radiology Dg Chest 2 View  Result Date: 08/16/2019 CLINICAL DATA:  Chest pain EXAM: CHEST - 2 VIEW COMPARISON:  Chest x-ray dated 06/27/2019 FINDINGS: Cardiomediastinal silhouette is within normal limits in size and configuration. Lungs are clear. Lung volumes are normal. No evidence of pneumonia. No pleural effusion. No pneumothorax seen. Osseous  structures about the chest are unremarkable. IMPRESSION: Normal chest x-ray.  Lungs are clear. Electronically Signed   By: Bary Richard M.D.   On: 08/16/2019 09:20    Procedures Procedures (including critical care time)  Medications Ordered in ED Medications - No data to display   Initial Impression / Assessment and Plan / ED Course  I have reviewed the triage vital signs and the nursing notes.  Pertinent labs & imaging results that were available during my care of the patient were reviewed by me and considered in my medical decision making (see chart for details).   Patient presents to the ED w/ complaints of  intermittent sharp/paresthetic well localized pains scattered throughout his entire body- head/trunk/extremities x4 over the past 1.5 days. Nontoxic appearing, no apparent distress, vitals without significant abnormality. Exam benign. No rashes. No erythema/warmth/fever additionally no URI/respiratory/GI sxs- do not suspect infectious source. His headache had gradual onset steady progression similar to prior & mild in nature- no red flags. No nuchal rigidity. No focal neuro deficits.  I-stat chem 8 w/o anemia or electrolyte derangement. He also  mentioned an episode of brief chest pain that was not exertional- per chart review patient was recently seen by cardiology for chest pain, felt to be idiopathic pleuritis, plan for echocardiogram to exclude heart disease and pericarditis, it does not appear the patient has had his echocardiogram yet. EKG obtained w/ no STEMI but some progression of previously present T wave inversions- will obtain 1x troponin given last episode of pain was 24 hour ago- < 2- doubt ACS, PERC negative doubt PE. No widened mediastinum, symmetric pulses- doubt dissection. CXR w/o infiltrate, edema, cardiomegaly, or PTX. Start on Naproxen. Follow up w/ PCP & cardiology. I discussed results, treatment plan, need for follow-up, and return precautions with the patient. Provided opportunity for questions, patient confirmed understanding and is in agreement with plan.     Findings and plan of care discussed with supervising physician Dr. Stevie Kern who is in agreement.   Final Clinical Impressions(s) / ED Diagnoses   Final diagnoses:  Generalized pain    ED Discharge Orders         Ordered    naproxen (NAPROSYN) 500 MG tablet  2 times daily     08/16/19 [address] [place], PA-C 08/16/19 1209    Milagros Loll, MD 08/17/19 [phone]

## 2019-08-26 ENCOUNTER — Other Ambulatory Visit: Payer: Self-pay

## 2019-08-26 ENCOUNTER — Encounter: Payer: Self-pay | Admitting: Cardiology

## 2019-08-26 ENCOUNTER — Ambulatory Visit (INDEPENDENT_AMBULATORY_CARE_PROVIDER_SITE_OTHER): Payer: Medicare Other | Admitting: Cardiology

## 2019-08-26 VITALS — BP 126/94 | HR 96 | Temp 95.0°F | Ht 73.0 in | Wt 248.0 lb

## 2019-08-26 DIAGNOSIS — R079 Chest pain, unspecified: Secondary | ICD-10-CM

## 2019-08-26 DIAGNOSIS — R030 Elevated blood-pressure reading, without diagnosis of hypertension: Secondary | ICD-10-CM

## 2019-08-26 NOTE — Patient Instructions (Signed)
Medication Instructions:  Your physician recommends that you continue on your current medications as directed. Please refer to the Current Medication list given to you today.  If you need a refill on your cardiac medications before your next appointment, please call your pharmacy.   Lab work: None  If you have labs (blood work) drawn today and your tests are completely normal, you will receive your results only by: . MyChart Message (if you have MyChart) OR . A paper copy in the mail If you have any lab test that is abnormal or we need to change your treatment, we will call you to review the results.  Testing/Procedures: None   Follow-Up: At [facility], you and your health needs are our priority.  As part of our continuing mission to provide you with exceptional heart care, we have created designated Provider Care Teams.  These Care Teams include your primary Cardiologist (physician) and Advanced Practice Providers (APPs -  Physician Assistants and Nurse Practitioners) who all work together to provide you with the care you need, when you need it. You will need a follow up appointment as needed if symptoms worsen or fail to improve.    

## 2019-08-26 NOTE — Progress Notes (Signed)
Cardiology Office Note:    Date:  08/26/2019   ID:  Joseph Hendrix, DOB 07-May-1986, MRN [id number]  PCP:  Bailey Mech, PA-C  Cardiologist:  Norman Herrlich, MD    Referring MD: Bernerd Pho*    ASSESSMENT:    1. Chest pain at rest   2. Elevated BP without diagnosis of hypertension    PLAN:    In order of problems listed above:  1. chest pain resolved idiopathic pleuritis and I would not recommend any further evaluation 2. Repeat blood pressure 126/86 at this time I will hold on antihypertensive agents   Next appointment: As needed   Medication Adjustments/Labs and Tests Ordered: Current medicines are reviewed at length with the patient today.  Concerns regarding medicines are outlined above.  No orders of the defined types were placed in this encounter.  No orders of the defined types were placed in this encounter.   No chief complaint on file.   History of Present Illness:    Joseph Hendrix is a 33 y.o. male with a hx of pleuritic chest pain last seen 07/08/2019. Compliance with diet, lifestyle and medications: Yes  With a nonsteroidal anti-inflammatory drug.  Quick complete resolution and no recurrence of chest pain.  He feels well no edema shortness of breath palpitation or syncope.  Seen in the emergency room 08/16/2019 with generalized pain EKG showed sinus rhythm no findings of pleuritis pericarditis and chest x-ray was normal.  Troponin was checked twice high-sensitivity it was normal renal function potassium were normal.  I reviewed the EKG showed sinus rhythm LVH voltage ST-T abnormality most consistent with repolarization Past Medical History:  Diagnosis Date  . Constipation 03/27/2019  . Elevated BP without diagnosis of hypertension 08/09/2016  . Gastroesophageal reflux disease without esophagitis 09/08/2016  . Musculoskeletal chest pain 03/27/2019    Past Surgical History:  Procedure Laterality Date  . NO PAST SURGERIES      Current  Medications: Current Meds  Medication Sig  . bismuth subsalicylate (PEPTO BISMOL) 262 MG/15ML suspension Take 30 mLs by mouth every 6 (six) hours as needed for diarrhea or loose stools.  . docusate sodium (COLACE) 250 MG capsule Take 1 capsule (250 mg total) by mouth daily.  Marland Kitchen loperamide (IMODIUM) 2 MG capsule Take 4 mg by mouth as needed for diarrhea or loose stools.  . naproxen (NAPROSYN) 500 MG tablet Take 1 tablet (500 mg total) by mouth 2 (two) times daily.     Allergies:   Patient has no known allergies.   Social History   Socioeconomic History  . Marital status: Single    Spouse name: Not on file  . Number of children: Not on file  . Years of education: Not on file  . Highest education level: Not on file  Occupational History  . Not on file  Social Needs  . Financial resource strain: Not on file  . Food insecurity    Worry: Not on file    Inability: Not on file  . Transportation needs    Medical: Not on file    Non-medical: Not on file  Tobacco Use  . Smoking status: Never Smoker  . Smokeless tobacco: Never Used  Substance and Sexual Activity  . Alcohol use: No  . Drug use: No  . Sexual activity: Never  Lifestyle  . Physical activity    Days per week: Not on file    Minutes per session: Not on file  . Stress: Not on file  Relationships  .  Social Herbalist on phone: Not on file    Gets together: Not on file    Attends religious service: Not on file    Active member of club or organization: Not on file    Attends meetings of clubs or organizations: Not on file    Relationship status: Not on file  Other Topics Concern  . Not on file  Social History Narrative  . Not on file     Family History: The patient's family history includes Diabetes in his brother, father, and mother; Hyperlipidemia in his brother, father, and mother; Kidney failure in his mother. There is no history of Heart attack. ROS:   Please see the history of present illness.     All other systems reviewed and are negative.  EKGs/Labs/Other Studies Reviewed:    The following studies were reviewed today  Recent Labs: 06/27/2019: Platelets 243 08/16/2019: BUN 14; Creatinine, Ser 0.90; Hemoglobin 15.6; Potassium 4.0; Sodium 140  Recent Lipid Panel No results found for: CHOL, TRIG, HDL, CHOLHDL, VLDL, LDLCALC, LDLDIRECT  Physical Exam:    VS:  BP (!) 126/94 (BP Location: Right Arm, Patient Position: Sitting, Cuff Size: Large)   Pulse 96   Temp (!) 95 F (35 C)   Ht 6\' 1"  (1.854 m)   Wt 248 lb (112.5 kg)   SpO2 97%   BMI 32.72 kg/m     Wt Readings from Last 3 Encounters:  08/26/19 248 lb (112.5 kg)  07/08/19 245 lb 1.9 oz (111.2 kg)  06/27/19 240 lb (108.9 kg)     GEN:  Well nourished, well developed in no acute distress HEENT: Normal NECK: No JVD; No carotid bruits LYMPHATICS: No lymphadenopathy CARDIAC: No pleuritic or pericardial rub RRR, no murmurs, rubs, gallops RESPIRATORY:  Clear to auscultation without rales, wheezing or rhonchi  ABDOMEN: Soft, non-tender, non-distended MUSCULOSKELETAL:  No edema; No deformity  SKIN: Warm and dry NEUROLOGIC:  Alert and oriented x 3 PSYCHIATRIC:  Normal affect    Signed, Shirlee More, MD  08/26/2019 1:51 PM    [facility]

## 2020-03-06 ENCOUNTER — Other Ambulatory Visit: Payer: Self-pay

## 2020-03-06 ENCOUNTER — Emergency Department: Payer: Medicare Other

## 2020-03-06 ENCOUNTER — Emergency Department
Admission: EM | Admit: 2020-03-06 | Discharge: 2020-03-06 | Disposition: A | Discharge status: Home / Self Care | Payer: Medicare Other | Attending: Emergency Medicine | Admitting: Emergency Medicine

## 2020-03-06 DIAGNOSIS — R0602 Shortness of breath: Secondary | ICD-10-CM | POA: Diagnosis not present

## 2020-03-06 DIAGNOSIS — Z20822 Contact with and (suspected) exposure to covid-19: Secondary | ICD-10-CM | POA: Insufficient documentation

## 2020-03-06 DIAGNOSIS — R0981 Nasal congestion: Secondary | ICD-10-CM | POA: Diagnosis not present

## 2020-03-06 DIAGNOSIS — R195 Other fecal abnormalities: Secondary | ICD-10-CM | POA: Diagnosis not present

## 2020-03-06 DIAGNOSIS — Z79899 Other long term (current) drug therapy: Secondary | ICD-10-CM | POA: Diagnosis not present

## 2020-03-06 DIAGNOSIS — R079 Chest pain, unspecified: Secondary | ICD-10-CM | POA: Diagnosis present

## 2020-03-06 LAB — BASIC METABOLIC PANEL
Anion gap: 5 (ref 5–15)
BUN: 12 mg/dL (ref 6–20)
CO2: 28 mmol/L (ref 22–32)
Calcium: 8.9 mg/dL (ref 8.9–10.3)
Chloride: 106 mmol/L (ref 98–111)
Creatinine, Ser: 0.94 mg/dL (ref 0.61–1.24)
GFR calc Af Amer: >60 mL/min (ref >60)
GFR calc non Af Amer: >60 mL/min (ref >60)
Glucose, Bld: 91 mg/dL (ref 70–99)
Potassium: 3.7 mmol/L (ref 3.5–5.1)
Sodium: 139 mmol/L (ref 135–145)

## 2020-03-06 LAB — CBC
HCT: 43.8 % (ref 39.0–52.0)
Hemoglobin: 14.3 g/dL (ref 13.0–17.0)
MCH: 28.4 pg (ref 26.0–34.0)
MCHC: 32.6 g/dL (ref 30.0–36.0)
MCV: 86.9 fL (ref 80.0–100.0)
Platelets: 230 10*3/uL (ref 150–400)
RBC: 5.04 MIL/uL (ref 4.22–5.81)
RDW: 13.1 % (ref 11.5–15.5)
WBC: 6 10*3/uL (ref 4.0–10.5)
nRBC: 0 % (ref 0.0–0.2)

## 2020-03-06 LAB — TROPONIN I (HIGH SENSITIVITY)
Troponin I (High Sensitivity): 4 ng/L (ref <18)
Troponin I (High Sensitivity): 3 ng/L (ref <18)

## 2020-03-06 NOTE — ED Provider Notes (Signed)
[facility] Emergency Department Provider Note  ____________________________________________   First MD Initiated Contact with Patient 03/06/20 2107     (approximate)  I have reviewed the triage vital signs and the nursing notes.   HISTORY  Chief Complaint Chest Pain    HPI Joseph Hendrix is a 34 y.o. male with history of hypertension who comes in with chest pain.  Patient reports having some chest discomfort for 1 week.  States the pain is like a pressure, constant although did go away this morning, nothing makes it better, nothing makes it worse.  Maybe a little bit of shortness of breath but none currently.  Denies any cough.  Does have a little congestion.  Patient also reports 1 watery stool every morning.  No other stools throughout the day.  Has not had Covid before.           Past Medical History:  Diagnosis Date  . Constipation 03/27/2019  . Elevated BP without diagnosis of hypertension 08/09/2016  . Gastroesophageal reflux disease without esophagitis 09/08/2016  . Musculoskeletal chest pain 03/27/2019    Patient Active Problem List   Diagnosis Date Noted  . Chest pain at rest 07/08/2019  . Constipation 03/27/2019  . Musculoskeletal chest pain 03/27/2019  . Gastroesophageal reflux disease without esophagitis 09/08/2016  . Elevated BP without diagnosis of hypertension 08/09/2016    Past Surgical History:  Procedure Laterality Date  . NO PAST SURGERIES      Prior to Admission medications   Medication Sig Start Date End Date Taking? Authorizing Provider  bismuth subsalicylate (PEPTO BISMOL) 262 MG/15ML suspension Take 30 mLs by mouth every 6 (six) hours as needed for diarrhea or loose stools.    [provider]  docusate sodium (COLACE) 250 MG capsule Take 1 capsule (250 mg total) by mouth daily. 01/28/19   Carlyle Basques P, PA-C  loperamide (IMODIUM) 2 MG capsule Take 4 mg by mouth as needed for diarrhea or loose stools.     [provider]  naproxen (NAPROSYN) 500 MG tablet Take 1 tablet (500 mg total) by mouth 2 (two) times daily. 08/16/19   Petrucelli, Samantha R, PA-C  omeprazole (PRILOSEC) 20 MG capsule TK 1 C PO ONCE D 01/12/19   [provider]    Allergies Patient has no known allergies.  Family History  Problem Relation Age of Onset  . Diabetes Mother   . Hyperlipidemia Mother   . Kidney failure Mother   . Diabetes Father   . Hyperlipidemia Father   . Diabetes Brother   . Hyperlipidemia Brother   . Heart attack Neg Hx     Social History Social History   Tobacco Use  . Smoking status: Never Smoker  . Smokeless tobacco: Never Used  Substance Use Topics  . Alcohol use: No  . Drug use: No      Review of Systems Constitutional: No fever/chills Eyes: No visual changes. ENT: No sore throat. Cardiovascular: Positive chest pain Respiratory: Denies shortness of breath. Gastrointestinal: No abdominal pain.  No nausea, no vomiting.  No diarrhea.  No constipation.  Loose stool Genitourinary: Negative for dysuria. Musculoskeletal: Negative for back pain. Skin: Negative for rash. Neurological: Negative for headaches, focal weakness or numbness. All other ROS negative ____________________________________________   PHYSICAL EXAM:  VITAL SIGNS: ED Triage Vitals [03/06/20 1519]  Enc Vitals Group     BP 130/88     Pulse Rate 85     Resp 16     Temp 98.3  F (36.8 C)     Temp Source Oral     SpO2 98 %     Weight 240 lb (108.9 kg)     Height 6\' 1"  (1.854 m)     Head Circumference      Peak Flow      Pain Score 9     Pain Loc      Pain Edu?      Excl. in [place]?     Constitutional: Alert and oriented. Well appearing and in no acute distress. Eyes: Conjunctivae are normal. EOMI. Head: Atraumatic. Nose: No congestion/rhinnorhea. Mouth/Throat: Mucous membranes are moist.   Neck: No stridor. Trachea Midline. FROM Cardiovascular: Normal rate, regular rhythm. Grossly  normal heart sounds.  Good peripheral circulation. Respiratory: Normal respiratory effort.  No retractions. Lungs CTAB. Gastrointestinal: Soft and nontender. No distention. No abdominal bruits.  Musculoskeletal: No lower extremity tenderness nor edema.  No joint effusions. Neurologic:  Normal speech and language. No gross focal neurologic deficits are appreciated.  Skin:  Skin is warm, dry and intact. No rash noted. Psychiatric: Mood and affect are normal. Speech and behavior are normal. GU: Deferred   ____________________________________________   LABS (all labs ordered are listed, but only abnormal results are displayed)  Labs Reviewed  SARS CORONAVIRUS 2 (TAT 6-24 HRS)  BASIC METABOLIC PANEL  CBC  TROPONIN I (HIGH SENSITIVITY)  TROPONIN I (HIGH SENSITIVITY)   ____________________________________________   ED ECG REPORT I, Vanessa Cor[place] de [place], the attending physician, personally viewed and interpreted this ECG.  EKG is sinus rate of 92, no obvious ST elevation or T wave inversions difficult to assess intervals.  There is a lot of artifact in this EKG will need to repeat it  Repeat EKG normal sinus rate of 75, no ST elevation, no T wave inversions, normal intervals ____________________________________________  RADIOLOGY Robert Bellow, personally viewed and evaluated these images (plain radiographs) as part of my medical decision making, as well as reviewing the written report by the radiologist.  ED MD interpretation: No evidence of any pneumonia  Official radiology report(s): DG Chest 2 View  Result Date: 03/06/2020 CLINICAL DATA:  34 year old male with chest pain. EXAM: CHEST - 2 VIEW COMPARISON:  Chest radiograph dated 08/16/2019. FINDINGS: There is no focal consolidation the origin the back. The cardiac silhouette is within limits. No acute osseous pathology. IMPRESSION: No active cardiopulmonary disease. Electronically Signed   By: Anner Crete M.D.   On: 03/06/2020 16:21     ____________________________________________   PROCEDURES  Procedure(s) performed (including Critical Care):  Procedures   ____________________________________________   INITIAL IMPRESSION / ASSESSMENT AND PLAN / ED COURSE   Elza Sortor was evaluated in Emergency Department on 03/06/2020 for the symptoms described in the history of present illness. He was evaluated in the context of the global COVID-19 pandemic, which necessitated consideration that the patient might be at risk for infection with the SARS-CoV-2 virus that causes COVID-19. Institutional protocols and algorithms that pertain to the evaluation of patients at risk for COVID-19 are in a state of rapid change based on information released by regulatory bodies including the CDC and federal and state organizations. These policies and algorithms were followed during the patient's care in the ED.    Most Likely DDx:  -MSK (atypical chest pain) but will get cardiac markers to evaluate for ACS given risk factors/age discussed with patient that the 1 loose stool every morning is nothing to be worried about given no abdominal pain is not having  other stooling throughout the day.       DDx that was also considered d/t potential to cause harm, but was found less likely based on history and physical (as detailed above): -PNA (no fevers, cough but CXR to evaluate) -PNX (reassured with equal b/l breath sounds, CXR to evaluate) -Symptomatic anemia (will get H&H) -Pulmonary embolism as no sob at rest, not pleuritic in nature, no hypoxia he denies any risk factors.  PERC negative -Aortic Dissection as no tearing pain and no radiation to the mid back, pulses equal -Pericarditis no rub on exam, EKG changes or hx to suggest dx -Tamponade (no notable SOB, tachycardic, hypotensive) -Esophageal rupture (no h/o diffuse vomitting/no crepitus)   Cardiac markers are negative x2.  No white count elevation to suggest infection.  No evidence  of anemia no evidence of electrolyte abnormalities or kidney dysfunction.  Chest x-ray without pneumonia  Given the congestion will also test for Covid.. Patient is to follow stay quarantine at home and can follow-up with PCP  I discussed the provisional nature of ED diagnosis, the treatment so far, the ongoing plan of care, follow up appointments and return precautions with the patient and any family or support people present. They expressed understanding and agreed with the plan, discharged home.          ____________________________________________   FINAL CLINICAL IMPRESSION(S) / ED DIAGNOSES   Final diagnoses:  Chest pain, unspecified type     MEDICATIONS GIVEN DURING THIS VISIT:  Medications - No data to display   ED Discharge Orders    None       Note:  This document was prepared using Dragon voice recognition software and may include unintentional dictation errors.   Concha Se, MD 03/06/20 2202

## 2020-03-06 NOTE — Discharge Instructions (Addendum)
Your heart work-up was reassuring and your chest x-ray was negative for pneumonia.  We are testing you for Covid.  Stay quarantine at home for the next 1 day.  You should take Tylenol 1 g every 8 hours and you can use some ibuprofen 600 every 6 hours with food if you develop the pain again.  Return the ER for worsening shortness of breath or worsening pain or any other concerns

## 2020-03-06 NOTE — ED Triage Notes (Signed)
Pt arrives to ED c/o L sided CP x 1 week. States some congestion, denies cough. A&O, ambulatory. No distress noted.

## 2020-03-07 LAB — SARS CORONAVIRUS 2 (TAT 6-24 HRS): SARS Coronavirus 2: NEGATIVE

## 2020-03-08 ENCOUNTER — Other Ambulatory Visit: Payer: Self-pay

## 2020-03-08 ENCOUNTER — Encounter: Payer: Self-pay | Admitting: Emergency Medicine

## 2020-03-08 ENCOUNTER — Ambulatory Visit
Admission: EM | Admit: 2020-03-08 | Discharge: 2020-03-08 | Disposition: A | Discharge status: Home / Self Care | Payer: Medicare Other | Attending: Urgent Care | Admitting: Urgent Care

## 2020-03-08 DIAGNOSIS — R079 Chest pain, unspecified: Secondary | ICD-10-CM

## 2020-03-08 DIAGNOSIS — F419 Anxiety disorder, unspecified: Secondary | ICD-10-CM

## 2020-03-08 DIAGNOSIS — R0789 Other chest pain: Secondary | ICD-10-CM | POA: Diagnosis not present

## 2020-03-08 DIAGNOSIS — R519 Headache, unspecified: Secondary | ICD-10-CM

## 2020-03-08 HISTORY — DX: Personal history of other infectious and parasitic diseases: Z86.19

## 2020-03-08 HISTORY — DX: Anxiety disorder, unspecified: F41.9

## 2020-03-08 MED ORDER — HYDROXYZINE HCL 25 MG PO TABS: 25.0000 mg | ORAL_TABLET | Freq: Three times a day (TID) | ORAL | 0 refills | Status: DC | PRN

## 2020-03-08 MED ORDER — SERTRALINE HCL 50 MG PO TABS: 50.0000 mg | ORAL_TABLET | Freq: Every day | ORAL | 2 refills | Status: DC

## 2020-03-08 NOTE — ED Triage Notes (Addendum)
Patient c/o tingling feeling all over his body, headache, anxiety and depression x 1 week. He was advised to be seen in the ED by his PCP.

## 2020-03-08 NOTE — Discharge Instructions (Addendum)
It was very nice seeing you today in clinic. Thank you for entrusting me with your care.   You have had a recent workup that was negative for cardiac issue. Your EKG today was normal. I am starting you on some medication for your anxiety. Be careful with the "as needed" one as it can make you sleepy.   Make arrangements to follow up with your regular doctor in 1 week for re-evaluation. If your symptoms/condition worsens, please seek follow up care either here or in the ER. Please remember, our [facility] providers are "right here with you" when you need [place].   Again, it was my pleasure to take care of you today. Thank you for choosing our clinic. I hope that you start to feel better quickly.   Quentin Mulling, MSN, APRN, FNP-C, CEN Advanced Practice Provider [facility]

## 2020-03-10 ENCOUNTER — Encounter: Payer: Self-pay | Admitting: Urgent Care

## 2020-03-10 NOTE — ED Provider Notes (Signed)
Joseph Hendrix, Joseph Hendrix   Name: Joseph Hendrix DOB: [birth year] MRN: [id number] CSN: [id number] PCP: Bailey Mech, PA-C  Arrival date and time:  03/08/20 1628  Chief Complaint:  Headache, Chest Pain, and Tingling  NOTE: Prior to seeing the patient today, I have reviewed the triage nursing documentation and vital signs. Clinical staff has updated patient's PMH/PSHx, current medication list, and drug allergies/intolerances to ensure comprehensive history available to assist in medical decision making.   History:   HPI: Joseph Hendrix is a 34 y.o. male who presents today with complaints of headache, anxiety, chest pain, and tingling all over his body. Symptoms started approximately 1 week ago. He states, "I am anxious and depressed". He is unable to identify anything that is triggering him to feel this way. Patient denies any SI, HI, or hallucinations. Pain is mainly in the LEFT side of his chest. Patient describes the pain as having a pressure like quality. Patient denies any blunt force trauma to his anterior or lateral chest wall. He denies radiation of the pain into his shoulder, neck, jaw, and subscapular areas. Patient is not having any pain in his LEFT upper extremity. He denies associated shortness of breath, nausea, vomiting, and diaphoresis. Patient does not have a history of gastrointestinal reflux. Pain is not reproducible with movement, palpation, or deep inspiration. Patient indicates that the pain is relieved/improved by rest. Patient advising that he has experienced similar episodes of pain in his chest in the past. Patient denies a past medical history significant for any known cardiac problems. Patient does not have a history of any sort of cardiac arrhythmias; no pacemaker or AICD. He notes that his family history is also negative for MI, CAD, heart failure, VTE, and sudden cardiac death. He has never been diagnosed with a DVT or PE in the past. He is not on daily anticoagulation  therapy. Patient does not take aspirin on a daily basis. Of note, patient was seen at the [facility] emergency department on 03/06/2020 for the same. Notes reviewed. Patient had an extensive workup, which included 2 set of negative troponins. CXR was negative for PNA. He was diagnosed with non-specific chest pain and advised to follow up on an outpatient basis. Patient states, "I told them I was anxious and they didn't do anything for that".   Past Medical History:  Diagnosis Date  . Constipation 03/27/2019  . Elevated BP without diagnosis of hypertension 08/09/2016  . Gastroesophageal reflux disease without esophagitis 09/08/2016  . History of gonorrhea   . Musculoskeletal chest pain 03/27/2019    Past Surgical History:  Procedure Laterality Date  . NO PAST SURGERIES      Family History  Problem Relation Age of Onset  . Diabetes Mother   . Hyperlipidemia Mother   . Kidney failure Mother   . Diabetes Father   . Hyperlipidemia Father   . Diabetes Brother   . Hyperlipidemia Brother   . Heart attack Neg Hx     Social History   Tobacco Use  . Smoking status: Never Smoker  . Smokeless tobacco: Never Used  Substance Use Topics  . Alcohol use: No  . Drug use: No    Patient Active Problem List   Diagnosis Date Noted  . Chest pain at rest 07/08/2019  . Constipation 03/27/2019  . Musculoskeletal chest pain 03/27/2019  . Gastroesophageal reflux disease without esophagitis 09/08/2016  . Elevated BP without diagnosis of hypertension 08/09/2016    Home Medications:    No outpatient medications have been  marked as taking for the 03/08/20 encounter [facility] Encounter).    Allergies:   Patient has no known allergies.  Review of Systems (ROS):  Review of systems NEGATIVE unless otherwise noted in narrative H&P section.   Vital Signs: Today's Vitals   03/08/20 1647 03/08/20 1648 03/08/20 1649 03/08/20 1750  BP:   131/89   Pulse:   81   Resp:   18   Temp:   98.4 F (36.9 C)     TempSrc:   Oral   SpO2:   99%   Weight:  240 lb (108.9 kg)    Height:  6\' 1"  (1.854 m)    PainSc: 9    9     Physical Exam: Physical Exam  Constitutional: He is oriented to person, place, and time and well-developed, well-nourished, and in no distress.  HENT:  Head: Normocephalic and atraumatic.  Eyes: Pupils are equal, round, and reactive to light.  Neck: No tracheal deviation present.  Cardiovascular: Normal rate, regular rhythm, normal heart sounds and intact distal pulses.  Pulmonary/Chest: Effort normal and breath sounds normal.  Abdominal: Soft. Normal appearance and bowel sounds are normal. He exhibits no distension. There is no abdominal tenderness.  Musculoskeletal:     Cervical back: Normal range of motion and neck supple.  Lymphadenopathy:    He has no cervical adenopathy.  Neurological: He is alert and oriented to person, place, and time. He has normal sensation, normal strength, normal reflexes and intact cranial nerves. Gait normal.  Skin: Skin is warm and dry. No rash noted. He is not diaphoretic.  Psychiatric: Memory, affect and judgment normal. His mood appears anxious. He expresses no homicidal and no suicidal ideation.  Patient very anxious. He is jumpy when touched. Patient unable to remain in a stationary position.   Nursing note and vitals reviewed.   Urgent Care Treatments / Results:   Orders Placed This Encounter  Procedures  . ED EKG    LABS: PLEASE NOTE: all labs that were ordered this encounter are listed, however only abnormal results are displayed. Labs Reviewed - No data to display  URGENT CARE ECG REPORT Date: 03/08/2020 Time ECG obtained: 1655 PM Rate: 76 bpm Rhythm: normal sinus rhythm Axis (leads I and aVF): normal Intervals: PR 148 ms. QTc 416 ms.  ST segment and T wave changes: No evidence of ST segment elevation or depression Comparison: Similar to previous tracing obtained on 03/06/2020  RADIOLOGY: No results  found.  PROCEDURES: Procedures  MEDICATIONS RECEIVED THIS VISIT: Medications - No data to display  PERTINENT CLINICAL COURSE NOTES/UPDATES:   Initial Impression / Assessment and Plan / Urgent Care Course:  Pertinent labs & imaging results that were available during my care of the patient were personally reviewed by me and considered in my medical decision making (see lab/imaging section of note for values and interpretations).  Joseph Hendrix is a 34 y.o. male who presents to [facility] today with complaints of Headache, Chest Pain, and Tingling  Patient is well appearing overall in clinic today. He does not appear to be in any acute distress. Presenting symptoms (see HPI) and exam as documented above. Patient very anxious. He has never been diagnosed with anxiety or depression in the past per his report. He denies SI/HI and has no hallucinations. Patient exhibits good insight and judgement in clinic. Pain in chest intermittent and reported to be worse with exacerbations of his anxiety. He denies associated SOB, nausea, vomiting, and diaphoresis. Recent negative cardiac workup  in the ED two days ago. EKG today normal. Suspect this is all related to severe anxiety. Discussed with attending on duty Adriana Simas, DO) who agrees with my assessment. Will plan the following outpatient care for this patient:   Start SSRI (sertraline 50 mg daily) today for anxiety. He was given a 90 day supply. Patient advised that this medication can take some time (up to 4 weeks) to become effective. Patient encouraged to remain compliant with daily dosing in order to gain the most benefit from this medication. Again, he denies SI/HI today. Patient advised that if he at anytime felt like he was a harm to himself or others, he should proceed to the ED for further evaluation and treatment. Patient contracts verbally for safety today and agrees with plan as discussed.    In efforts to avoid BZO therapy and provide  patient with some immediate relief from his anxiety, will start patient on hydroxyzine TID PRN anxiety. Patient advised that this medication can cause him to be sleepy, thus he should exercise caution with use.   Discussed follow up with primary care physician in 1 week for re-evaluation. Discussed potential need for SSRI dose titration and/or the addition of other therapies aimed at helping with his severe anxiety. I have reviewed the follow up and strict return precautions for any new or worsening symptoms. Patient is aware of symptoms that would be deemed urgent/emergent, and would thus require further evaluation in the emergency department. At the time of discharge, he verbalized understanding and consent with the discharge plan as it was reviewed with him. All questions were fielded by provider and/or clinic staff prior to patient discharge.    Final Clinical Impressions / Urgent Care Diagnoses:   Final diagnoses:  Anxiety  Chest pain, unspecified type  Nonintractable headache, unspecified chronicity pattern, unspecified headache type    New Prescriptions:  [place] Controlled Substance Registry consulted? Not Applicable  Meds ordered this encounter  Medications  . sertraline (ZOLOFT) 50 MG tablet    Sig: Take 1 tablet (50 mg total) by mouth daily.    Dispense:  30 tablet    Refill:  2  . hydrOXYzine (ATARAX/VISTARIL) 25 MG tablet    Sig: Take 1 tablet (25 mg total) by mouth every 8 (eight) hours as needed.    Dispense:  30 tablet    Refill:  0    Recommended Follow up Care:  Patient encouraged to follow up with the following provider within the specified time frame, or sooner as dictated by the severity of his symptoms. As always, he was instructed that for any urgent/emergent care needs, he should seek care either here or in the emergency department for more immediate evaluation.  Follow-up Information    Joellyn Quails, Rudy Jew, PA-C In 1 week.   Specialty: Physician  Assistant Why: General reassessment of symptoms if not improving Contact information: [address] Laurell Josephs [address] [place] [address] [phone]         NOTE: This note was prepared using Dragon dictation software along with smaller phrase technology. Despite my best ability to proofread, there is the potential that transcriptional errors may still occur from this process, and are completely unintentional.    Verlee Monte, NP 03/10/20 1052

## 2020-12-11 ENCOUNTER — Other Ambulatory Visit: Payer: Self-pay

## 2020-12-11 ENCOUNTER — Encounter (HOSPITAL_COMMUNITY): Payer: Self-pay

## 2020-12-11 ENCOUNTER — Ambulatory Visit (HOSPITAL_COMMUNITY)
Admission: EM | Admit: 2020-12-11 | Discharge: 2020-12-11 | Disposition: A | Discharge status: Home / Self Care | Payer: Medicare Other | Attending: Internal Medicine | Admitting: Internal Medicine

## 2020-12-11 DIAGNOSIS — K219 Gastro-esophageal reflux disease without esophagitis: Secondary | ICD-10-CM | POA: Diagnosis not present

## 2020-12-11 DIAGNOSIS — R059 Cough, unspecified: Secondary | ICD-10-CM | POA: Diagnosis present

## 2020-12-11 DIAGNOSIS — Z79899 Other long term (current) drug therapy: Secondary | ICD-10-CM | POA: Insufficient documentation

## 2020-12-11 DIAGNOSIS — Z8249 Family history of ischemic heart disease and other diseases of the circulatory system: Secondary | ICD-10-CM | POA: Insufficient documentation

## 2020-12-11 DIAGNOSIS — J069 Acute upper respiratory infection, unspecified: Secondary | ICD-10-CM

## 2020-12-11 DIAGNOSIS — F419 Anxiety disorder, unspecified: Secondary | ICD-10-CM | POA: Diagnosis not present

## 2020-12-11 DIAGNOSIS — U071 COVID-19: Secondary | ICD-10-CM | POA: Diagnosis not present

## 2020-12-11 LAB — SARS CORONAVIRUS 2 (TAT 6-24 HRS): SARS Coronavirus 2: POSITIVE — AB

## 2020-12-11 MED ORDER — ACETAMINOPHEN 325 MG PO TABS
650.0000 mg | ORAL_TABLET | Freq: Once | ORAL | Status: AC
  Administered 2020-12-11: 650 mg via ORAL

## 2020-12-11 MED ORDER — ACETAMINOPHEN 325 MG PO TABS
ORAL_TABLET | ORAL | Status: AC
  Filled 2020-12-11: qty 2

## 2020-12-11 NOTE — Discharge Instructions (Signed)
Your COVID test is pending.  You should self quarantine until the test result is back.    Take Tylenol or ibuprofen as needed for fever or discomfort.  Rest and keep yourself hydrated.    Follow-up with your primary care provider if your symptoms are not improving.     

## 2020-12-11 NOTE — ED Provider Notes (Signed)
[facility]    CSN: [id number] Arrival date & time: 12/11/20  1010      History   Chief Complaint Chief Complaint  Patient presents with  . Sore Throat    Since this am    HPI Joseph Hendrix is a 35 y.o. male.   Patient presents with fever, headache, sore throat, and cough since this morning.  Treatment attempted at home with Tylenol and Robitussin.  He denies rash, shortness of breath, vomiting, diarrhea, or other symptoms.  His medical history includes anxiety, GERD, constipation.  The history is provided by the patient and medical records.    Past Medical History:  Diagnosis Date  . Anxiety   . Constipation 03/27/2019  . Elevated BP without diagnosis of hypertension 08/09/2016  . Gastroesophageal reflux disease without esophagitis 09/08/2016  . History of gonorrhea   . Musculoskeletal chest pain 03/27/2019    Patient Active Problem List   Diagnosis Date Noted  . Chest pain at rest 07/08/2019  . Constipation 03/27/2019  . Musculoskeletal chest pain 03/27/2019  . Gastroesophageal reflux disease without esophagitis 09/08/2016  . Elevated BP without diagnosis of hypertension 08/09/2016    Past Surgical History:  Procedure Laterality Date  . NO PAST SURGERIES         Home Medications    Prior to Admission medications   Medication Sig Start Date End Date Taking? Authorizing Provider  hydrOXYzine (ATARAX/VISTARIL) 25 MG tablet Take 1 tablet (25 mg total) by mouth every 8 (eight) hours as needed. 03/08/20   Verlee Monte, NP  sertraline (ZOLOFT) 50 MG tablet Take 1 tablet (50 mg total) by mouth daily. 03/08/20   Verlee Monte, NP  omeprazole (PRILOSEC) 20 MG capsule TK 1 C PO ONCE D 01/12/19 03/08/20  [provider]    Family History Family History  Problem Relation Age of Onset  . Diabetes Mother   . Hyperlipidemia Mother   . Kidney failure Mother   . Diabetes Father   . Hyperlipidemia Father   . Diabetes Brother   . Hyperlipidemia Brother    . Heart attack Neg Hx     Social History Social History   Tobacco Use  . Smoking status: Never Smoker  . Smokeless tobacco: Never Used  Vaping Use  . Vaping Use: Never used  Substance Use Topics  . Alcohol use: No  . Drug use: No     Allergies   Patient has no known allergies.   Review of Systems Review of Systems  Constitutional: Positive for fever. Negative for chills.  HENT: Positive for sore throat. Negative for ear pain.   Eyes: Negative for pain and visual disturbance.  Respiratory: Positive for cough. Negative for shortness of breath.   Cardiovascular: Negative for chest pain and palpitations.  Gastrointestinal: Negative for abdominal pain, diarrhea and vomiting.  Genitourinary: Negative for dysuria and hematuria.  Musculoskeletal: Negative for arthralgias and back pain.  Skin: Negative for color change and rash.  Neurological: Negative for seizures and syncope.  All other systems reviewed and are negative.    Physical Exam Triage Vital Signs ED Triage Vitals  Enc Vitals Group     BP      Pulse      Resp      Temp      Temp src      SpO2      Weight      Height      Head Circumference      Peak  Flow      Pain Score      Pain Loc      Pain Edu?      Excl. in GC?    No data found.  Updated Vital Signs BP 132/81 (BP Location: Left Arm)   Pulse (!) 111   Temp (!) 101.9 F (38.8 C) (Oral)   Resp 18   SpO2 97%   Visual Acuity Right Eye Distance:   Left Eye Distance:   Bilateral Distance:    Right Eye Near:   Left Eye Near:    Bilateral Near:     Physical Exam Vitals and nursing note reviewed.  Constitutional:      General: He is not in acute distress.    Appearance: He is well-developed and well-nourished. He is not ill-appearing.  HENT:     Head: Normocephalic and atraumatic.     Right Ear: Tympanic membrane normal.     Left Ear: Tympanic membrane normal.     Nose: Nose normal.     Mouth/Throat:     Mouth: Mucous membranes are  moist.     Pharynx: Oropharynx is clear.  Eyes:     Conjunctiva/sclera: Conjunctivae normal.  Cardiovascular:     Rate and Rhythm: Normal rate and regular rhythm.     Heart sounds: Normal heart sounds.  Pulmonary:     Effort: Pulmonary effort is normal. No respiratory distress.     Breath sounds: Normal breath sounds.  Abdominal:     Palpations: Abdomen is soft.     Tenderness: There is no abdominal tenderness.  Musculoskeletal:        General: No edema.     Cervical back: Neck supple.  Skin:    General: Skin is warm and dry.  Neurological:     General: No focal deficit present.     Mental Status: He is alert and oriented to person, place, and time.  Psychiatric:        Mood and Affect: Mood and affect and mood normal.        Behavior: Behavior normal.      UC Treatments / Results  Labs (all labs ordered are listed, but only abnormal results are displayed) Labs Reviewed  SARS CORONAVIRUS 2 (TAT 6-24 HRS)    EKG   Radiology No results found.  Procedures Procedures (including critical care time)  Medications Ordered in UC Medications  acetaminophen (TYLENOL) tablet 650 mg (650 mg Oral Given 12/11/20 1028)    Initial Impression / Assessment and Plan / UC Course  I have reviewed the triage vital signs and the nursing notes.  Pertinent labs & imaging results that were available during my care of the patient were reviewed by me and considered in my medical decision making (see chart for details).   Viral URI.  COVID pending.  Instructed patient to self quarantine until the test results are back.  Discussed symptomatic treatment including Tylenol, rest, hydration.  Instructed patient to follow up with PCP if his symptoms are not improving.  Patient agrees to plan of care.    Final Clinical Impressions(s) / UC Diagnoses   Final diagnoses:  Viral upper respiratory tract infection     Discharge Instructions     Your COVID test is pending.  You should self  quarantine until the test result is back.    Take Tylenol or ibuprofen as needed for fever or discomfort.  Rest and keep yourself hydrated.    Follow-up with your primary care provider if  your symptoms are not improving.        ED Prescriptions    None     PDMP not reviewed this encounter.   Mickie Bail, NP 12/11/20 1059

## 2020-12-11 NOTE — ED Triage Notes (Signed)
Patient states he woke this am with a sore throat and productive cough. Pt is aox4 and ambulatory.

## 2020-12-26 ENCOUNTER — Ambulatory Visit (INDEPENDENT_AMBULATORY_CARE_PROVIDER_SITE_OTHER): Payer: Medicare Other | Admitting: Gastroenterology

## 2020-12-26 ENCOUNTER — Encounter: Payer: Self-pay | Admitting: Gastroenterology

## 2020-12-26 VITALS — BP 128/84 | HR 106 | Ht 73.0 in | Wt 251.5 lb

## 2020-12-26 DIAGNOSIS — K581 Irritable bowel syndrome with constipation: Secondary | ICD-10-CM | POA: Diagnosis not present

## 2020-12-26 DIAGNOSIS — K219 Gastro-esophageal reflux disease without esophagitis: Secondary | ICD-10-CM

## 2020-12-26 MED ORDER — PANTOPRAZOLE SODIUM 40 MG PO TBEC: 40.0000 mg | DELAYED_RELEASE_TABLET | Freq: Every day | ORAL | 6 refills | Status: DC

## 2020-12-26 MED ORDER — LUBIPROSTONE 24 MCG PO CAPS: 24.0000 ug | ORAL_CAPSULE | Freq: Two times a day (BID) | ORAL | 6 refills | Status: DC

## 2020-12-26 NOTE — Patient Instructions (Addendum)
If you are age 37 or older, your body mass index should be between 23-30. Your Body mass index is 33.18 kg/m. If this is out of the aforementioned range listed, please consider follow up with your Primary Care Provider.  If you are age 44 or younger, your body mass index should be between 19-25. Your Body mass index is 33.18 kg/m. If this is out of the aformentioned range listed, please consider follow up with your Primary Care Provider.   You have been scheduled for an endoscopy. Please follow written instructions given to you at your visit today. If you use inhalers (even only as needed), please bring them with you on the day of your procedure.  We have sent the following medications to your pharmacy for you to pick up at your convenience: Protonix Amitiza   Thank you,  Dr. Lynann Bologna'

## 2020-12-26 NOTE — Progress Notes (Signed)
Chief Complaint:   Referring Provider:  Bernerd Pho*      ASSESSMENT AND PLAN;   #1. GERD with atypical chest pain despite PPIs. Nl CBC, CMP, TSH.  #2. IBS-C  Plan: -EGD for further evaluation.  Discussed risks and benefits. -If he still has problems, [place] abdo. -For now we will continue protonix 40mg  po qd. -Increase Amitiza po Bid with meals #60, 6 refiils. -Increase Water intake and continue Colace 1 tablet p.o. once a day -Start playing basketball (he used to play in high school) -Holf off on colon at this time since he has no red flag symptoms.  At the present time, would recommend screening colon at age 54.  Certainly, earlier if there are any red flag symptoms or if he continues to have symptoms. -Minimize nonsteroidals.   HPI:    Joseph Hendrix is a 35 y.o. male  With some associated anxiety, HTN C/O GERD with chest pain x 1 yr.  Describes this as nonradiating pain associated with heartburn and regurgitation.  He was given a trial of omeprazole which did not help.  He has been on Protonix for the last 6 months which does "help a little".  No odynophagia or dysphagia.  No melena or hematochezia.  Seen in ED 03/06/2020 and 08/16/2019 with noncardiac chest pains.  He had negative EKG, troponin, chest x-ray and was told to follow-up with GI.  No sodas, chocolates, chewing gums, artificial sweeteners and candy. No NSAIDs.   Had URI December 11, 2020-was found to be positive for COVID-19.  He was more or less asymptomatic.  Did not require any further treatment.  He denies having any further upper respiratory tract problems.  No shortness of breath.  No cough.  C/O longstanding constipation- since 2017, with pellet-like stools, abdominal bloating which gets better with defecation., no blood. Rare diarrhea.  Has been on Colace 1 tablet p.o. once a day and most recently Amitiza 24 QD x last 6 months.BMs 1/week to 3/week on Amitiza.  Pleased with the progress.  No  ETOH.  Had Nl CBC, CMP, TSH, CRP, sed rate, ANA, rheumatoid work-up.  This was reviewed.  Past Medical History:  Diagnosis Date  . Anxiety   . Constipation 03/27/2019  . Elevated BP without diagnosis of hypertension 08/09/2016  . Gastroesophageal reflux disease without esophagitis 09/08/2016  . History of gonorrhea   . IBS (irritable bowel syndrome)   . Musculoskeletal chest pain 03/27/2019    Past Surgical History:  Procedure Laterality Date  . NO PAST SURGERIES      Family History  Problem Relation Age of Onset  . Diabetes Mother   . Hyperlipidemia Mother   . Kidney failure Mother   . Diabetes Father   . Hyperlipidemia Father   . Diabetes Brother   . Hyperlipidemia Brother   . Heart attack Neg Hx   . Colon cancer Neg Hx   . Esophageal cancer Neg Hx     Social History   Tobacco Use  . Smoking status: Never Smoker  . Smokeless tobacco: Never Used  Vaping Use  . Vaping Use: Never used  Substance Use Topics  . Alcohol use: No  . Drug use: No    Current Outpatient Medications  Medication Sig Dispense Refill  . cyclobenzaprine (FLEXERIL) 5 MG tablet Take 5 mg by mouth daily as needed.    . fexofenadine (ALLEGRA) 180 MG tablet Take by mouth.    . hydrOXYzine (ATARAX/VISTARIL) 25 MG tablet Take 1  tablet (25 mg total) by mouth every 8 (eight) hours as needed. 30 tablet 0  . lubiprostone (AMITIZA) 24 MCG capsule Take 24 mcg by mouth daily.    . pantoprazole (PROTONIX) 40 MG tablet Take 40 mg by mouth daily.     No current facility-administered medications for this visit.    No Known Allergies  Review of Systems:  Constitutional: Denies fever, chills, diaphoresis, appetite change and fatigue.  HEENT: Denies photophobia, eye pain, redness, hearing loss, ear pain, congestion, sore throat, rhinorrhea, sneezing, mouth sores, neck pain, neck stiffness and tinnitus.   Respiratory: Denies SOB, DOE, cough, chest tightness,  and wheezing.   Cardiovascular: Denies chest pain,  palpitations and leg swelling.  Genitourinary: Denies dysuria, urgency, frequency, hematuria, flank pain and difficulty urinating.  Musculoskeletal: Denies myalgias, back pain, joint swelling, arthralgias and gait problem.  Skin: No rash.  Neurological: Denies dizziness, seizures, syncope, weakness, light-headedness, numbness and headaches.  Hematological: Denies adenopathy. Easy bruising, personal or family bleeding history  Psychiatric/Behavioral: Has some situational anxiety. No depression     Physical Exam:    BP 128/84   Pulse (!) 106   Ht 6\' 1"  (1.854 m)   Wt 251 lb 8 oz (114.1 kg)   BMI 33.18 kg/m  Wt Readings from Last 3 Encounters:  12/26/20 251 lb 8 oz (114.1 kg)  03/08/20 240 lb (108.9 kg)  03/06/20 240 lb (108.9 kg)   Constitutional:  Well-developed, in no acute distress. Psychiatric: Normal mood and affect. Behavior is normal. HEENT: Pupils normal.  Conjunctivae are normal. No scleral icterus. Neck supple.  Cardiovascular: Normal rate, regular rhythm. No edema Pulmonary/chest: Effort normal and breath sounds normal. No wheezing, rales or rhonchi. Abdominal: Soft, nondistended. Nontender. Bowel sounds active throughout. There are no masses palpable. No hepatomegaly. Rectal: Deferred Neurological: Alert and oriented to person place and time. Skin: Skin is warm and dry. No rashes noted.  Data Reviewed: I have personally reviewed following labs and imaging studies  CBC: CBC Latest Ref Rng & Units 03/06/2020 08/16/2019 06/27/2019  WBC 4.0 - 10.5 K/uL 6.0 - 5.4  Hemoglobin 13.0 - 17.0 g/dL 08/27/2019 22.9 79.8  Hematocrit 39.0 - 52.0 % 43.8 46.0 46.2  Platelets 150 - 400 K/uL 230 - 243    CMP: CMP Latest Ref Rng & Units 03/06/2020 08/16/2019 06/27/2019  Glucose 70 - 99 mg/dL 91 08/27/2019) 194(R)  BUN 6 - 20 mg/dL 12 14 11   Creatinine 0.61 - 1.24 mg/dL 740(C 1.44  Sodium 135 - 145 mmol/L 139 140 138  Potassium 3.5 - 5.1 mmol/L 3.7 4.0 4.2  Chloride 98 - 111 mmol/L 106 101  106  CO2 22 - 32 mmol/L 28 - 25  Calcium 8.9 - 10.3 mg/dL 8.9 - 8.9  Total Protein 6.5 - 8.1 g/dL - - -  Total Bilirubin 0.3 - 1.2 mg/dL - - -  Alkaline Phos 38 - 126 U/L - - -  AST 15 - 41 U/L - - -  ALT 17 - 63 U/L - - -      8.18, MD 12/26/2020, 2:20 PM  Cc: Edman Circle*

## 2021-01-12 ENCOUNTER — Other Ambulatory Visit: Payer: Self-pay

## 2021-01-12 ENCOUNTER — Encounter: Payer: Self-pay | Admitting: Gastroenterology

## 2021-01-12 ENCOUNTER — Ambulatory Visit (AMBULATORY_SURGERY_CENTER): Payer: Medicare Other | Admitting: Gastroenterology

## 2021-01-12 VITALS — BP 140/84 | HR 89 | Temp 97.5°F | Resp 16 | Ht 73.0 in | Wt 251.0 lb

## 2021-01-12 DIAGNOSIS — K581 Irritable bowel syndrome with constipation: Secondary | ICD-10-CM

## 2021-01-12 DIAGNOSIS — K219 Gastro-esophageal reflux disease without esophagitis: Secondary | ICD-10-CM

## 2021-01-12 DIAGNOSIS — K2970 Gastritis, unspecified, without bleeding: Secondary | ICD-10-CM | POA: Diagnosis not present

## 2021-01-12 DIAGNOSIS — K2950 Unspecified chronic gastritis without bleeding: Secondary | ICD-10-CM | POA: Diagnosis not present

## 2021-01-12 MED ORDER — SODIUM CHLORIDE 0.9 % IV SOLN: 500.0000 mL | Freq: Once | INTRAVENOUS | Status: DC

## 2021-01-12 NOTE — Progress Notes (Signed)
pt tolerated well. VSS. awake and to recovery. Report given to RN. Bite block insitu to recovery. No trauama.

## 2021-01-12 NOTE — Op Note (Signed)
[facility] Patient Name: Joseph Hendrix Procedure Date: 01/12/2021 10:03 AM MRN: [id number] Endoscopist: Lynann Bologna , MD Age: 35 Referring MD:  Date of Birth: Sep 24, 1986 Gender: Male Account #: [id number] Procedure:                Upper GI endoscopy Indications:              Noncardiac chest pains. GERD. Medicines:                Monitored Anesthesia Care Procedure:                Pre-Anesthesia Assessment:                           - Prior to the procedure, a History and Physical                            was performed, and patient medications and                            allergies were reviewed. The patient's tolerance of                            previous anesthesia was also reviewed. The risks                            and benefits of the procedure and the sedation                            options and risks were discussed with the patient.                            All questions were answered, and informed consent                            was obtained. Prior Anticoagulants: The patient has                            taken no previous anticoagulant or antiplatelet                            agents. ASA Grade Assessment: II - A patient with                            mild systemic disease. After reviewing the risks                            and benefits, the patient was deemed in                            satisfactory condition to undergo the procedure.                           After obtaining informed consent, the endoscope was  passed under direct vision. Throughout the                            procedure, the patient's blood pressure, pulse, and                            oxygen saturations were monitored continuously. The                            Endoscope was introduced through the mouth, and                            advanced to the second part of duodenum. The upper                            GI endoscopy was accomplished  without difficulty.                            The patient tolerated the procedure well. Scope In: Scope Out: Findings:                 The esophagus was mildly tortuous but normal with                            well-defined Z-line at 40 cm. Examined by NBI.                            Multiple biopsies obtained from proximal, mid and                            distal esophagus.                           Localized mild inflammation characterized by                            erythema was found in the gastric antrum. Biopsies                            were taken with a cold forceps for histology. The                            retroflexed examination of the cardia was normal                            with GE junction flap to be [employer] 1.                           The examined duodenum was normal. Complications:            No immediate complications. Estimated Blood Loss:     Estimated blood loss: none. Impression:               -Mild gastritis. Recommendation:           - Patient has a contact number  available for                            emergencies. The signs and symptoms of potential                            delayed complications were discussed with the                            patient. Return to normal activities tomorrow.                            Written discharge instructions were provided to the                            patient.                           - Resume previous diet.                           - Continue present medications.                           - Await pathology results.                           - Avoid ibuprofen, naproxen, or other non-steroidal                            anti-inflammatory drugs.                           - The findings and recommendations were discussed                            with the designated responsible adult Chief Operating Officer)                           - Return to GI clinic PRN. If still with problems,                             will perform further work-up. Lynann Bologna, MD 01/12/2021 10:23:02 AM This report has been signed electronically.

## 2021-01-12 NOTE — Progress Notes (Signed)
Called to room to assist during endoscopic procedure.  Patient ID and intended procedure confirmed with present staff. Received instructions for my participation in the procedure from the performing physician.  

## 2021-01-12 NOTE — Patient Instructions (Signed)
Resume previous diet Continue current medications Await pathology results Avoid ibuprofen, naproxen, or any other non steroidal anti-inflammatory drugs Return to GI clinic as needed if you are still having problems YOU HAD AN ENDOSCOPIC PROCEDURE TODAY AT THE [facility]:   Refer to the procedure report that was given to you for any specific questions about what was found during the examination.  If the procedure report does not answer your questions, please call your gastroenterologist to clarify.  If you requested that your care partner not be given the details of your procedure findings, then the procedure report has been included in a sealed envelope for you to review at your convenience later.  YOU SHOULD EXPECT: Some feelings of bloating in the abdomen. Passage of more gas than usual.  Walking can help get rid of the air that was put into your GI tract during the procedure and reduce the bloating. If you had a lower endoscopy (such as a colonoscopy or flexible sigmoidoscopy) you may notice spotting of blood in your stool or on the toilet paper. If you underwent a bowel prep for your procedure, you may not have a normal bowel movement for a few days.  Please Note:  You might notice some irritation and congestion in your nose or some drainage.  This is from the oxygen used during your procedure.  There is no need for concern and it should clear up in a day or so.  SYMPTOMS TO REPORT IMMEDIATELY:   Following upper endoscopy (EGD)  Vomiting of blood or coffee ground material  New chest pain or pain under the shoulder blades  Painful or persistently difficult swallowing  New shortness of breath  Fever of 100F or higher  Black, tarry-looking stools  For urgent or emergent issues, a gastroenterologist can be reached at any hour by calling [phone]. Do not use MyChart messaging for urgent concerns.   DIET:  We do recommend a small meal at first, but then you may proceed to  your regular diet.  Drink plenty of fluids but you should avoid alcoholic beverages for 24 hours.  ACTIVITY:  You should plan to take it easy for the rest of today and you should NOT DRIVE or use heavy machinery until tomorrow (because of the sedation medicines used during the test).    FOLLOW UP: Our staff will call the number listed on your records 48-72 hours following your procedure to check on you and address any questions or concerns that you may have regarding the information given to you following your procedure. If we do not reach you, we will leave a message.  We will attempt to reach you two times.  During this call, we will ask if you have developed any symptoms of COVID 19. If you develop any symptoms (ie: fever, flu-like symptoms, shortness of breath, cough etc.) before then, please call [phone].  If you test positive for Covid 19 in the 2 weeks post procedure, please call and report this information to [place].    If any biopsies were taken you will be contacted by phone or by letter within the next 1-3 weeks.  Please call us at [phone] if you have not heard about the biopsies in 3 weeks.   SIGNATURES/CONFIDENTIALITY: You and/or your care partner have signed paperwork which will be entered into your electronic medical record.  These signatures attest to the fact that that the information above on your After Visit Summary has been reviewed and is understood.  Full responsibility of the confidentiality of this discharge information lies with you and/or your care-partner.

## 2021-01-16 ENCOUNTER — Telehealth: Payer: Self-pay | Admitting: *Deleted

## 2021-01-16 ENCOUNTER — Telehealth: Payer: Self-pay

## 2021-01-16 NOTE — Telephone Encounter (Signed)
Left message on answering machine. 

## 2021-01-16 NOTE — Telephone Encounter (Signed)
Attempted 2nd f/u phone call. No answer. Left message.  °

## 2021-01-22 ENCOUNTER — Encounter: Payer: Self-pay | Admitting: Gastroenterology

## 2021-08-16 ENCOUNTER — Ambulatory Visit
Admission: EM | Admit: 2021-08-16 | Discharge: 2021-08-16 | Disposition: A | Discharge status: Home / Self Care | Payer: Medicare Other | Attending: Physician Assistant | Admitting: Physician Assistant

## 2021-08-16 ENCOUNTER — Other Ambulatory Visit: Payer: Self-pay

## 2021-08-16 DIAGNOSIS — L03011 Cellulitis of right finger: Secondary | ICD-10-CM

## 2021-08-16 MED ORDER — DOXYCYCLINE HYCLATE 100 MG PO CAPS: 100.0000 mg | ORAL_CAPSULE | Freq: Two times a day (BID) | ORAL | 0 refills | Status: AC

## 2021-08-16 MED ORDER — MUPIROCIN 2 % EX OINT: 1.0000 "application " | TOPICAL_OINTMENT | Freq: Three times a day (TID) | CUTANEOUS | 0 refills | Status: DC

## 2021-08-16 NOTE — ED Provider Notes (Signed)
[facility]    CSN: [id number] Arrival date & time: 08/16/21  1335      History   Chief Complaint Chief Complaint  Patient presents with   Hand Pain    R Ring     HPI Joseph Hendrix is a 35 y.o. male presenting for approximately 2-week history of redness, swelling and pain of the cuticle of the right fourth digit.  Patient says he cut his cuticles and symptoms of the swelling and pain started shortly after.  He denies any drainage from the area.  He has been trying to keep it clean himself but says it seems to getting worse and not better.  Admits to occasional throbbing when he accidentally hits the finger.  No other complaints.  HPI  Past Medical History:  Diagnosis Date   Anxiety    Constipation 03/27/2019   Elevated BP without diagnosis of hypertension 08/09/2016   Gastroesophageal reflux disease without esophagitis 09/08/2016   History of gonorrhea    IBS (irritable bowel syndrome)    Musculoskeletal chest pain 03/27/2019    Patient Active Problem List   Diagnosis Date Noted   Chest pain at rest 07/08/2019   Constipation 03/27/2019   Musculoskeletal chest pain 03/27/2019   Gastroesophageal reflux disease without esophagitis 09/08/2016   Elevated BP without diagnosis of hypertension 08/09/2016    Past Surgical History:  Procedure Laterality Date   NO PAST SURGERIES         Home Medications    Prior to Admission medications   Medication Sig Start Date End Date Taking? Authorizing Provider  doxycycline (VIBRAMYCIN) 100 MG capsule Take 1 capsule (100 mg total) by mouth 2 (two) times daily for 7 days. 08/16/21 08/23/21 Yes Shirlee Latch, PA-C  mupirocin ointment (BACTROBAN) 2 % Apply 1 application topically 3 (three) times daily. 08/16/21  Yes Eusebio Friendly B, PA-C  cyclobenzaprine (FLEXERIL) 5 MG tablet Take 5 mg by mouth daily as needed. 11/16/20   [provider]  fexofenadine (ALLEGRA) 180 MG tablet Take by mouth. 02/05/20 02/04/21  [provider]  hydrOXYzine (ATARAX/VISTARIL) 25 MG tablet Take 1 tablet (25 mg total) by mouth every 8 (eight) hours as needed. Patient not taking: No sig reported 03/08/20   Verlee Monte, NP  lubiprostone (AMITIZA) 24 MCG capsule Take 1 capsule (24 mcg total) by mouth 2 (two) times daily with a meal. 12/26/20   Lynann Bologna, MD  pantoprazole (PROTONIX) 40 MG tablet Take 1 tablet (40 mg total) by mouth daily. Patient not taking: No sig reported 12/26/20   Lynann Bologna, MD  omeprazole (PRILOSEC) 20 MG capsule TK 1 C PO ONCE D 01/12/19 03/08/20  [provider]    Family History Family History  Problem Relation Age of Onset   Diabetes Mother    Hyperlipidemia Mother    Kidney failure Mother    Diabetes Father    Hyperlipidemia Father    Diabetes Brother    Hyperlipidemia Brother    Heart attack Neg Hx    Colon cancer Neg Hx    Esophageal cancer Neg Hx     Social History Social History   Tobacco Use   Smoking status: Never   Smokeless tobacco: Never  Vaping Use   Vaping Use: Never used  Substance Use Topics   Alcohol use: No   Drug use: No     Allergies   Patient has no known allergies.   Review of Systems Review of Systems  Constitutional:  Negative  for fever.  Musculoskeletal:  Negative for arthralgias and joint swelling.  Skin:  Positive for color change. Negative for wound.  Neurological:  Negative for numbness.    Physical Exam Triage Vital Signs ED Triage Vitals  Enc Vitals Group     BP 08/16/21 1404 134/89     Pulse Rate 08/16/21 1404 84     Resp 08/16/21 1404 18     Temp 08/16/21 1404 98.5 F (36.9 C)     Temp Source 08/16/21 1404 Oral     SpO2 08/16/21 1404 100 %     Weight 08/16/21 1402 237 lb (107.5 kg)     Height 08/16/21 1402 6\' 1"  (1.854 m)     Head Circumference --      Peak Flow --      Pain Score 08/16/21 1401 6     Pain Loc --      Pain Edu? --      Excl. in GC? --    No data found.  Updated Vital Signs BP 134/89 (BP  Location: Left Arm)   Pulse 84   Temp 98.5 F (36.9 C) (Oral)   Resp 18   Ht 6\' 1"  (1.854 m)   Wt 237 lb (107.5 kg)   SpO2 100%   BMI 31.27 kg/m      Physical Exam Vitals and nursing note reviewed.  Constitutional:      General: He is not in acute distress.    Appearance: Normal appearance. He is well-developed. He is not ill-appearing.  HENT:     Head: Normocephalic and atraumatic.  Eyes:     General: No scleral icterus.    Conjunctiva/sclera: Conjunctivae normal.  Cardiovascular:     Rate and Rhythm: Normal rate.     Pulses: Normal pulses.  Pulmonary:     Effort: Pulmonary effort is normal. No respiratory distress.  Musculoskeletal:     Cervical back: Neck supple.  Skin:    General: Skin is warm and dry.     Comments: There is moderate swelling, erythema of cuticle of right 4th digit. Area is TTP  Neurological:     General: No focal deficit present.     Mental Status: He is alert. Mental status is at baseline.     Motor: No weakness.     Coordination: Coordination normal.     Gait: Gait normal.  Psychiatric:        Mood and Affect: Mood normal.        Behavior: Behavior normal.        Thought Content: Thought content normal.     UC Treatments / Results  Labs (all labs ordered are listed, but only abnormal results are displayed) Labs Reviewed - No data to display  EKG   Radiology No results found.  Procedures Procedures (including critical care time)  PARONYCHIA DRAINAGE: Patient gives verbal permission to drain paronychia.  Area cleansed with alcohol and then anesthetized with pain ease topical anesthetic.  Then I used a 21-gauge needle along the cuticle edge.  Only minor bleeding and no pustular drainage noted.  Area cleaned with alcohol again and applied a Band-Aid.  Patient tolerated well.  Medications Ordered in UC Medications - No data to display  Initial Impression / Assessment and Plan / UC Course  I have reviewed the triage vital signs and  the nursing notes.  Pertinent labs & imaging results that were available during my care of the patient were reviewed by me and considered in my medical  decision making (see chart for details).  35 year old male presenting for paronychia of the right fourth digit.  Unable to successfully I&D the paronychia.  We will treat him at this time with doxycycline and mupirocin ointment and have him perform warm Epsom salt soaks.  Close monitoring and reviewed return and ED precautions.  Final Clinical Impressions(s) / UC Diagnoses   Final diagnoses:  Paronychia of finger of right hand     Discharge Instructions      -Unfortunately I was not successful draining the paronychia today -I do advise warm water soaks or warm water and Epsom salt soaks a couple times a day.  I have sent an ointment for you to apply to the finger as well and an oral antibiotic. -You can have Tylenol and/or ibuprofen as needed for discomfort but you should start to feel a lot better in the next couple of days.  For any worsening symptoms, please return.     ED Prescriptions     Medication Sig Dispense Auth. Provider   doxycycline (VIBRAMYCIN) 100 MG capsule Take 1 capsule (100 mg total) by mouth 2 (two) times daily for 7 days. 14 capsule Eusebio Friendly B, PA-C   mupirocin ointment (BACTROBAN) 2 % Apply 1 application topically 3 (three) times daily. 22 g Shirlee Latch, PA-C      PDMP not reviewed this encounter.   Shirlee Latch, PA-C 08/16/21 1539

## 2021-08-16 NOTE — ED Triage Notes (Signed)
Patient c/o right ring finger is infected has been going on for 2 weeks. Pt has swelling and redness around nail bed.

## 2021-08-16 NOTE — Discharge Instructions (Addendum)
-  Unfortunately I was not successful draining the paronychia today -I do advise warm water soaks or warm water and Epsom salt soaks a couple times a day.  I have sent an ointment for you to apply to the finger as well and an oral antibiotic. -You can have Tylenol and/or ibuprofen as needed for discomfort but you should start to feel a lot better in the next couple of days.  For any worsening symptoms, please return.

## 2021-12-18 ENCOUNTER — Encounter: Payer: Self-pay | Admitting: Emergency Medicine

## 2021-12-18 ENCOUNTER — Emergency Department: Payer: Medicare Other

## 2021-12-18 ENCOUNTER — Emergency Department
Admission: EM | Admit: 2021-12-18 | Discharge: 2021-12-18 | Disposition: A | Discharge status: Home / Self Care | Payer: Medicare Other | Attending: Emergency Medicine | Admitting: Emergency Medicine

## 2021-12-18 ENCOUNTER — Other Ambulatory Visit: Payer: Self-pay

## 2021-12-18 DIAGNOSIS — R000 Tachycardia, unspecified: Secondary | ICD-10-CM | POA: Insufficient documentation

## 2021-12-18 DIAGNOSIS — R42 Dizziness and giddiness: Secondary | ICD-10-CM | POA: Diagnosis not present

## 2021-12-18 DIAGNOSIS — E876 Hypokalemia: Secondary | ICD-10-CM | POA: Diagnosis not present

## 2021-12-18 DIAGNOSIS — R0789 Other chest pain: Secondary | ICD-10-CM | POA: Diagnosis not present

## 2021-12-18 DIAGNOSIS — R079 Chest pain, unspecified: Secondary | ICD-10-CM | POA: Diagnosis present

## 2021-12-18 LAB — CBC
HCT: 43.1 % (ref 39.0–52.0)
Hemoglobin: 14.1 g/dL (ref 13.0–17.0)
MCH: 27.9 pg (ref 26.0–34.0)
MCHC: 32.7 g/dL (ref 30.0–36.0)
MCV: 85.3 fL (ref 80.0–100.0)
Platelets: 264 10*3/uL (ref 150–400)
RBC: 5.05 MIL/uL (ref 4.22–5.81)
RDW: 13.2 % (ref 11.5–15.5)
WBC: 7 10*3/uL (ref 4.0–10.5)
nRBC: 0 % (ref 0.0–0.2)

## 2021-12-18 LAB — TROPONIN I (HIGH SENSITIVITY)
Troponin I (High Sensitivity): <2 ng/L (ref <18)
Troponin I (High Sensitivity): 3 ng/L (ref <18)

## 2021-12-18 LAB — BASIC METABOLIC PANEL
Anion gap: 7 (ref 5–15)
BUN: 10 mg/dL (ref 6–20)
CO2: 28 mmol/L (ref 22–32)
Calcium: 8.6 mg/dL — ABNORMAL LOW (ref 8.9–10.3)
Chloride: 104 mmol/L (ref 98–111)
Creatinine, Ser: 1 mg/dL (ref 0.61–1.24)
GFR, Estimated: >60 mL/min (ref >60)
Glucose, Bld: 129 mg/dL — ABNORMAL HIGH (ref 70–99)
Potassium: 3.4 mmol/L — ABNORMAL LOW (ref 3.5–5.1)
Sodium: 139 mmol/L (ref 135–145)

## 2021-12-18 LAB — D-DIMER, QUANTITATIVE: D-Dimer, Quant: <0.27 ug/mL-FEU (ref 0.00–0.50)

## 2021-12-18 MED ORDER — KETOROLAC TROMETHAMINE 30 MG/ML IJ SOLN
15.0000 mg | Freq: Once | INTRAMUSCULAR | Status: AC
Start: 2021-12-18 — End: 2021-12-18
  Administered 2021-12-18: 15 mg via INTRAVENOUS
  Filled 2021-12-18: qty 1

## 2021-12-18 NOTE — ED Notes (Signed)
See triage note  presents with some chest discomfort   states pain increases with movement and inspiration  no fever or cough

## 2021-12-18 NOTE — ED Triage Notes (Signed)
C/O left sided chest pain intermittently x 3 weeks.  Arrives AAOx3.  Skin warm and dry. No SOB/ DOE.  NAD

## 2021-12-18 NOTE — ED Provider Triage Note (Signed)
Emergency Medicine Provider Triage Evaluation Note  Joseph Hendrix, a 36 y.o. male  was evaluated in triage.  Pt complains of left-sided chest wall pain.  Patient presented intermit episodes of left pectoralis muscle pain with referral towards a central chest.  He denies any associated nausea, vomiting, shortness of breath, or diaphoresis.  He reports onset of symptoms last week.  Review of Systems  Positive: Left chest wall pain  Negative: NV, SOB  Physical Exam  BP (!) 158/99 (BP Location: Left Arm)    Pulse (!) 111    Temp 99.4 F (37.4 C) (Oral)    Resp 16    Ht 6\' 1"  (1.854 m)    Wt 107.5 kg    SpO2 96%    BMI 31.27 kg/m  Gen:   Awake, no distress   Resp:  Normal effort  MSK:   Moves extremities without difficulty  Other:  CVS: RRR  Medical Decision Making  Medically screening exam initiated at 5:01 PM.  Appropriate orders placed.  Joseph Hendrix was informed that the remainder of the evaluation will be completed by another provider, this initial triage assessment does not replace that evaluation, and the importance of remaining in the ED until their evaluation is complete.  Patient ED evaluation of left-sided anterior chest wall pain.  Patient reports onset of symptoms over the last week.   Candida Peeling, PA-C 12/18/21 1702

## 2021-12-18 NOTE — Discharge Instructions (Signed)
Please take Tylenol and ibuprofen/Advil for your pain.  It is safe to take them together, or to alternate them every few hours.  Take up to 1000mg of Tylenol at a time, up to 4 times per day.  Do not take more than 4000 mg of Tylenol in 24 hours.  For ibuprofen, take 400-600 mg, 4-5 times per day. ° ° °

## 2021-12-18 NOTE — ED Provider Notes (Signed)
[facility] Provider Note    Event Date/Time   First MD Initiated Contact with Patient 12/18/21 1849     (approximate)   History   Chest Pain   HPI  Joseph Hendrix is a 36 y.o. male who presents to the ED for evaluation of Chest Pain   History obesity, GERD and IBS.  I review outpatient GI visit from January 2022.  I review endoscopy from February 2022, mild gastritis.  Patient presents to the ED for evaluation of 3 weeks of intermittent left-sided chest pain.  He reports intermittent pain lasting a few minutes at a time, up to 10 times per day over the past 3 weeks.  He reports no coexisting symptoms, until today when he developed some mild dizziness alongside the pain during 1 of these episodes.  Due to this, he presents to the ED for evaluation.  He does report the pain is precipitated by arm movement.  He reports no pain right now and feels fine.  Denies shortness of breath, cough, fever, abdominal pain, emesis or postprandial worsening of his pain.  Of note, he does report for the past few weeks, since the new year, he has been exercising more, including more weight lifting and bench presses with a sore chest.  Physical Exam   Triage Vital Signs: ED Triage Vitals  Enc Vitals Group     BP 12/18/21 1649 (!) 158/99     Pulse Rate 12/18/21 1649 (!) 111     Resp 12/18/21 1649 16     Temp 12/18/21 1649 99.4 F (37.4 C)     Temp Source 12/18/21 1649 Oral     SpO2 12/18/21 1649 96 %     Weight 12/18/21 1645 236 lb 15.9 oz (107.5 kg)     Height 12/18/21 1645 6\' 1"  (1.854 m)     Head Circumference --      Peak Flow --      Pain Score 12/18/21 1645 2     Pain Loc --      Pain Edu? --      Excl. in GC? --     Most recent vital signs: Vitals:   12/18/21 1649 12/18/21 2012  BP: (!) 158/99 (!) 160/100  Pulse: (!) 111 89  Resp: 16 20  Temp: 99.4 F (37.4 C)   SpO2: 96% 98%    General: Awake, no distress.  CV:  Good peripheral perfusion.   Tachycardic and regular Resp:  Normal effort.  Abd:  No distention.  MSK:  No deformity noted.  Mild tenderness to palpation to his pectoral muscles bilaterally without overlying skin changes or signs of trauma. Neuro:  No focal deficits appreciated. Other:     ED Results / Procedures / Treatments   Labs (all labs ordered are listed, but only abnormal results are displayed) Labs Reviewed  BASIC METABOLIC PANEL - Abnormal; Notable for the following components:      Result Value   Potassium 3.4 (*)    Glucose, Bld 129 (*)    Calcium 8.6 (*)    All other components within normal limits  CBC  D-DIMER, QUANTITATIVE  TROPONIN I (HIGH SENSITIVITY)  TROPONIN I (HIGH SENSITIVITY)    EKG Sinus tachycardia, rate of 110 bpm.  Normal axis and intervals.  Nonspecific ST changes laterally and inferiorly.   RADIOLOGY CXR reviewed by me without evidence of acute cardiopulmonary pathology.  Official radiology report(s): DG Chest 2 View  Result Date: 12/18/2021 CLINICAL DATA:  Left  side chest pain EXAM: CHEST - 2 VIEW COMPARISON:  03/06/2020 FINDINGS: The heart size and mediastinal contours are within normal limits. Both lungs are clear. The visualized skeletal structures are unremarkable. IMPRESSION: Negative Electronically Signed   By: Charlett Nose M.D.   On: 12/18/2021 17:28    PROCEDURES and INTERVENTIONS:  .1-3 Lead EKG Interpretation Performed by: Delton Prairie, MD Authorized by: Delton Prairie, MD     Interpretation: normal     ECG rate:  104   ECG rate assessment: tachycardic     Rhythm: sinus tachycardia     Ectopy: none     Conduction: normal    Medications  ketorolac (TORADOL) 30 MG/ML injection 15 mg (15 mg Intravenous Given 12/18/21 1926)     IMPRESSION / MDM / ASSESSMENT AND PLAN / ED COURSE  I reviewed the triage vital signs and the nursing notes.  36 year old male without cardiac history presents to the ED with atypical chest pains, without evidence of acute  pathology, and suitable for outpatient management.  He is tachycardic in triage, but hemodynamically stable and not hypoxic.  Exam with reproducible chest pain and well-appearing patient without evidence of neurologic or vascular deficits.  Blood work is benign, normal CBC and marginal hypokalemia is noted.  2 troponins are negative and his D-dimer is also negative.  CXR without infiltrate or PTX.  Has resolution of chest pain after Toradol.  While I considered admission for this patient, his tachycardia resolves, chest pain resolved and has a benign work-up, so I think outpatient management is suitable.  We discussed return precautions.  Clinical Course as of 12/18/21 2022  Mon Dec 18, 2021  2006 Reassessed.  Feeling fine.  No symptoms.  We discussed reassuring work-up and outpatient management.  He is agreeable. [DS]    Clinical Course User Index [DS] Delton Prairie, MD     FINAL CLINICAL IMPRESSION(S) / ED DIAGNOSES   Final diagnoses:  Other chest pain     Rx / DC Orders   ED Discharge Orders     None        Note:  This document was prepared using Dragon voice recognition software and may include unintentional dictation errors.   Delton Prairie, MD 12/18/21 2022

## 2022-03-29 ENCOUNTER — Emergency Department
Admission: EM | Admit: 2022-03-29 | Discharge: 2022-03-29 | Disposition: A | Discharge status: Home / Self Care | Payer: Medicare Other | Attending: Emergency Medicine | Admitting: Emergency Medicine

## 2022-03-29 ENCOUNTER — Encounter: Payer: Self-pay | Admitting: Emergency Medicine

## 2022-03-29 ENCOUNTER — Other Ambulatory Visit: Payer: Self-pay

## 2022-03-29 DIAGNOSIS — S199XXA Unspecified injury of neck, initial encounter: Secondary | ICD-10-CM | POA: Diagnosis present

## 2022-03-29 DIAGNOSIS — S161XXA Strain of muscle, fascia and tendon at neck level, initial encounter: Secondary | ICD-10-CM | POA: Insufficient documentation

## 2022-03-29 DIAGNOSIS — R1032 Left lower quadrant pain: Secondary | ICD-10-CM

## 2022-03-29 DIAGNOSIS — X58XXXA Exposure to other specified factors, initial encounter: Secondary | ICD-10-CM | POA: Insufficient documentation

## 2022-03-29 DIAGNOSIS — K5900 Constipation, unspecified: Secondary | ICD-10-CM | POA: Insufficient documentation

## 2022-03-29 LAB — COMPREHENSIVE METABOLIC PANEL
ALT: 16 U/L (ref 0–44)
AST: 22 U/L (ref 15–41)
Albumin: 4.1 g/dL (ref 3.5–5.0)
Alkaline Phosphatase: 77 U/L (ref 38–126)
Anion gap: 7 (ref 5–15)
BUN: 12 mg/dL (ref 6–20)
CO2: 26 mmol/L (ref 22–32)
Calcium: 9 mg/dL (ref 8.9–10.3)
Chloride: 104 mmol/L (ref 98–111)
Creatinine, Ser: 1.04 mg/dL (ref 0.61–1.24)
GFR, Estimated: >60 mL/min (ref >60)
Glucose, Bld: 100 mg/dL — ABNORMAL HIGH (ref 70–99)
Potassium: 3.5 mmol/L (ref 3.5–5.1)
Sodium: 137 mmol/L (ref 135–145)
Total Bilirubin: 0.7 mg/dL (ref 0.3–1.2)
Total Protein: 8.3 g/dL — ABNORMAL HIGH (ref 6.5–8.1)

## 2022-03-29 LAB — CBC
HCT: 44.9 % (ref 39.0–52.0)
Hemoglobin: 14.4 g/dL (ref 13.0–17.0)
MCH: 27.6 pg (ref 26.0–34.0)
MCHC: 32.1 g/dL (ref 30.0–36.0)
MCV: 86.2 fL (ref 80.0–100.0)
Platelets: 259 10*3/uL (ref 150–400)
RBC: 5.21 MIL/uL (ref 4.22–5.81)
RDW: 12.9 % (ref 11.5–15.5)
WBC: 6.9 10*3/uL (ref 4.0–10.5)
nRBC: 0 % (ref 0.0–0.2)

## 2022-03-29 LAB — URINALYSIS, ROUTINE W REFLEX MICROSCOPIC
Bacteria, UA: NONE SEEN
Bilirubin Urine: NEGATIVE
Glucose, UA: NEGATIVE mg/dL
Ketones, ur: 20 mg/dL — AB
Leukocytes,Ua: NEGATIVE
Nitrite: NEGATIVE
Protein, ur: NEGATIVE mg/dL
Specific Gravity, Urine: 1.017 (ref 1.005–1.030)
Squamous Epithelial / HPF: NONE SEEN (ref 0–5)
pH: 5 (ref 5.0–8.0)

## 2022-03-29 LAB — LIPASE, BLOOD: Lipase: 26 U/L (ref 11–51)

## 2022-03-29 MED ORDER — KETOROLAC TROMETHAMINE 30 MG/ML IJ SOLN
30.0000 mg | Freq: Once | INTRAMUSCULAR | Status: AC
  Administered 2022-03-29: 30 mg via INTRAMUSCULAR
  Filled 2022-03-29: qty 1

## 2022-03-29 NOTE — Discharge Instructions (Signed)

## 2022-03-29 NOTE — ED Triage Notes (Signed)
Pt here with abd and neck pain. Neck pain started last Thurs and abd pain started Sat morning. Pt states neck hurts on the left side when he turns it. Pt states abd pain is LLQ and radiates to his legs and arms. Pt denies N/V/D. ?

## 2022-03-29 NOTE — ED Provider Notes (Signed)
? ?[facility] ?Provider Note ? ? ? Event Date/Time  ? First MD Initiated Contact with Patient 03/29/22 1254   ?  (approximate) ? ? ?History  ? ?Chief Complaint ?Abdominal Pain and Neck Pain ? ? ?HPI ? ?Joseph Hendrix is a 36 y.o. male with past medical history of GERD and anxiety who presents to the ED complaining of neck and abdominal pain.  Patient reports that about 5 days ago he woke up with pain in the left side of his neck.  He describes it as an aching and throbbing that is worse when he turns his head to the left side.  He denies any recent falls or trauma to his head or neck area, does state that he felt like he might have "slept wrong."  He denies any pain moving into his chest and has not had any fevers, cough, or shortness of breath.  He does report some pain in the left lower quadrant of his abdomen that has been present intermittently over the past 5 days.  He describes pain as aching and mild currently, denies any associated nausea, vomiting, or diarrhea but does endorse recent constipation for the past 3 days.  He has tried an over-the-counter stool softener without relief of his constipation or abdominal pain. ?  ? ? ?Physical Exam  ? ?Triage Vital Signs: ?ED Triage Vitals [03/29/22 1113]  ?Enc Vitals Group  ?   BP (!) 157/99  ?   Pulse Rate 89  ?   Resp 18  ?   Temp 98.6 ?F (37 ?C)  ?   Temp Source Oral  ?   SpO2 97 %  ?   Weight 237 lb (107.5 kg)  ?   Height 6\' 2"  (1.88 m)  ?   Head Circumference   ?   Peak Flow   ?   Pain Score 6  ?   Pain Loc   ?   Pain Edu?   ?   Excl. in GC?   ? ? ?Most recent vital signs: ?Vitals:  ? 03/29/22 1113  ?BP: (!) 157/99  ?Pulse: 89  ?Resp: 18  ?Temp: 98.6 ?F (37 ?C)  ?SpO2: 97%  ? ? ?Constitutional: Alert and oriented. ?Eyes: Conjunctivae are normal. ?Head: Atraumatic. ?Nose: No congestion/rhinnorhea. ?Mouth/Throat: Mucous membranes are moist.  ?Neck: No midline cervical spine tenderness to palpation, tenderness to palpation noted over left  trapezius. ?Cardiovascular: Normal rate, regular rhythm. Grossly normal heart sounds.  2+ radial pulses bilaterally. ?Respiratory: Normal respiratory effort.  No retractions. Lungs CTAB. ?Gastrointestinal: Soft and nontender. No distention. ?Musculoskeletal: No lower extremity tenderness nor edema.  ?Neurologic:  Normal speech and language. No gross focal neurologic deficits are appreciated. ? ? ? ?ED Results / Procedures / Treatments  ? ?Labs ?(all labs ordered are listed, but only abnormal results are displayed) ?Labs Reviewed  ?COMPREHENSIVE METABOLIC PANEL - Abnormal; Notable for the following components:  ?    Result Value  ? Glucose, Bld 100 (*)   ? Total Protein 8.3 (*)   ? All other components within normal limits  ?URINALYSIS, ROUTINE W REFLEX MICROSCOPIC - Abnormal; Notable for the following components:  ? Color, Urine YELLOW (*)   ? APPearance CLEAR (*)   ? Hgb urine dipstick MODERATE (*)   ? Ketones, ur 20 (*)   ? All other components within normal limits  ?LIPASE, BLOOD  ?CBC  ? ? ? ?EKG ? ?ED ECG REPORT ?05/29/22, the attending physician, personally viewed  and interpreted this ECG. ? ? Date: 03/29/2022 ? EKG Time: 14:17 ? Rate: 89 ? Rhythm: normal sinus rhythm ? Axis: Normal ? Intervals:none ? ST&T Change: None ? ?PROCEDURES: ? ?Critical Care performed: No ? ?Procedures ? ? ?MEDICATIONS ORDERED IN ED: ?Medications  ?ketorolac (TORADOL) 30 MG/ML injection 30 mg (30 mg Intramuscular Given 03/29/22 1421)  ? ? ? ?IMPRESSION / MDM / ASSESSMENT AND PLAN / ED COURSE  ?I reviewed the triage vital signs and the nursing notes. ?             ?               ? ?36 y.o. male with past medical history of GERD and anxiety who presents to the ED complaining of left neck pain and abdominal pain intermittently over the past 5 days, neck pain worse with turning to the left. ? ?Differential diagnosis includes, but is not limited to, cervical strain, cervical spine injury, ACS, meningitis, carotid dissection,  diverticulitis, constipation, gastroenteritis, kidney stone. ? ?Patient well-appearing and in no acute distress, vital signs are unremarkable and he has a benign abdominal exam.  Pain in his neck is reproducible with palpation over his left trapezius, also worse with movement of his head to the left.  Symptoms seem consistent with cervical strain, EKG shows no evidence of arrhythmia or ischemia and I doubt cardiac etiology for symptoms.  No fever or stiffness to suggest meningitis and no neurologic symptoms to suggest dissection.  We will treat symptomatically with IM Toradol and patient counseled to alternate Tylenol and ibuprofen at home.  Additionally, with his benign abdominal exam I do not feel CT imaging is indicated at this time.  Labs are also reassuring with CBC showing no anemia or leukocytosis, BMP without AKI or electrolyte abnormality, LFTs and lipase within normal limits.  UA also shows no signs of infection or blood to suggest kidney stone.  He does report recent constipation which could be contributing to his abdominal pain and he was counseled on over-the-counter management of constipation.  Patient counseled to return to the ED for new or worsening symptoms, patient agrees with plan. ? ?  ? ? ?FINAL CLINICAL IMPRESSION(S) / ED DIAGNOSES  ? ?Final diagnoses:  ?Strain of neck muscle, initial encounter  ?Left lower quadrant abdominal pain  ?Constipation, unspecified constipation type  ? ? ? ?Rx / DC Orders  ? ?ED Discharge Orders   ? ? None  ? ?  ? ? ? ?Note:  This document was prepared using Dragon voice recognition software and may include unintentional dictation errors. ?  ?Chesley Noon, MD ?03/29/22 1426 ? ?

## 2022-07-29 ENCOUNTER — Ambulatory Visit
Admission: EM | Admit: 2022-07-29 | Discharge: 2022-07-29 | Disposition: A | Discharge status: Home / Self Care | Payer: Medicare Other | Attending: Urgent Care | Admitting: Urgent Care

## 2022-07-29 ENCOUNTER — Encounter: Payer: Self-pay | Admitting: Emergency Medicine

## 2022-07-29 DIAGNOSIS — R030 Elevated blood-pressure reading, without diagnosis of hypertension: Secondary | ICD-10-CM

## 2022-07-29 DIAGNOSIS — K219 Gastro-esophageal reflux disease without esophagitis: Secondary | ICD-10-CM

## 2022-07-29 DIAGNOSIS — R0789 Other chest pain: Secondary | ICD-10-CM

## 2022-07-29 MED ORDER — LIDOCAINE VISCOUS HCL 2 % MT SOLN
15.0000 mL | Freq: Once | OROMUCOSAL | Status: AC
  Administered 2022-07-29: 15 mL via ORAL

## 2022-07-29 MED ORDER — ALUM & MAG HYDROXIDE-SIMETH 200-200-20 MG/5ML PO SUSP
30.0000 mL | Freq: Once | ORAL | Status: AC
  Administered 2022-07-29: 30 mL via ORAL

## 2022-07-29 MED ORDER — DICYCLOMINE HCL 20 MG PO TABS
20.0000 mg | ORAL_TABLET | Freq: Three times a day (TID) | ORAL | 0 refills | Status: DC
Start: 2022-07-29 — End: 2024-02-05

## 2022-07-29 MED ORDER — PANTOPRAZOLE SODIUM 20 MG PO TBEC: 20.0000 mg | DELAYED_RELEASE_TABLET | Freq: Every day | ORAL | 0 refills | Status: DC

## 2022-07-29 NOTE — ED Provider Notes (Signed)
[facility]    CSN: [id number] Arrival date & time: 07/29/22  1332      History   Chief Complaint Chief Complaint  Patient presents with   Sore Throat   Chest Pain    HPI Joseph Hendrix is a 36 y.o. male.   Pleasant 35 year old male with a known history of GERD presents today due to a 3 to 4-day history of chest discomfort.  He states he stopped his pantoprazole a while back because they thought they "cured it".  He states 3 to 4 days ago, he was having a "pinching like chest pain" that would last 15 to 20 seconds.  He states the next day he had a pinching pain that was around his left arm.  That also only lasted seconds.  He states those pains have not been present over the past 2 days.  He is concerned however because his GERD symptoms have returned, and not gone away.  He states he is having epigastric and substernal chest discomfort that extends from his throat to his stomach.  He states it is burning in nature. He had a complete GI workup one year ago including EGD, patient states that they "found nothing". States current sx worse after eating or with laying flat. Patient had a similar episode in January, and had a work-up in the ER, and was deemed to have both a GI and musculoskeletal cause of his chest pain at that time.  Patient reports in our office, 20 minutes after administration of the GI cocktail, that all of his symptoms have resolved.   Sore Throat Associated symptoms include chest pain.  Chest Pain   Past Medical History:  Diagnosis Date   Anxiety    Constipation 03/27/2019   Elevated BP without diagnosis of hypertension 08/09/2016   Gastroesophageal reflux disease without esophagitis 09/08/2016   History of gonorrhea    IBS (irritable bowel syndrome)    Musculoskeletal chest pain 03/27/2019    Patient Active Problem List   Diagnosis Date Noted   Chest pain at rest 07/08/2019   Constipation 03/27/2019   Musculoskeletal chest pain 03/27/2019    Gastroesophageal reflux disease without esophagitis 09/08/2016   Elevated BP without diagnosis of hypertension 08/09/2016    Past Surgical History:  Procedure Laterality Date   NO PAST SURGERIES         Home Medications    Prior to Admission medications   Medication Sig Start Date End Date Taking? Authorizing Provider  dicyclomine (BENTYL) 20 MG tablet Take 1 tablet (20 mg total) by mouth 4 (four) times daily -  before meals and at bedtime. As needed 07/29/22  Yes Carine Nordgren L, PA  pantoprazole (PROTONIX) 20 MG tablet Take 1 tablet (20 mg total) by mouth daily. 07/29/22  Yes Deaira Leckey L, PA  omeprazole (PRILOSEC) 20 MG capsule TK 1 C PO ONCE D 01/12/19 03/08/20  [provider]    Family History Family History  Problem Relation Age of Onset   Diabetes Mother    Hyperlipidemia Mother    Kidney failure Mother    Diabetes Father    Hyperlipidemia Father    Diabetes Brother    Hyperlipidemia Brother    Heart attack Neg Hx    Colon cancer Neg Hx    Esophageal cancer Neg Hx     Social History Social History   Tobacco Use   Smoking status: Never   Smokeless tobacco: Never  Vaping Use   Vaping Use: Never used  Substance Use Topics   Alcohol use: No   Drug use: No     Allergies   Patient has no known allergies.   Review of Systems Review of Systems  Cardiovascular:  Positive for chest pain.  As per HPI   Physical Exam Triage Vital Signs ED Triage Vitals  Enc Vitals Group     BP 07/29/22 1353 (!) 146/90     Pulse Rate 07/29/22 1353 96     Resp 07/29/22 1353 17     Temp 07/29/22 1353 98.9 F (37.2 C)     Temp Source 07/29/22 1353 Oral     SpO2 07/29/22 1353 99 %     Weight --      Height --      Head Circumference --      Peak Flow --      Pain Score 07/29/22 1350 0     Pain Loc --      Pain Edu? --      Excl. in GC? --    No data found.  Updated Vital Signs BP (!) 146/90 (BP Location: Left Arm)   Pulse 96   Temp 98.9 F (37.2 C)  (Oral)   Resp 17   SpO2 99%   Visual Acuity Right Eye Distance:   Left Eye Distance:   Bilateral Distance:    Right Eye Near:   Left Eye Near:    Bilateral Near:     Physical Exam Vitals and nursing note reviewed.  Constitutional:      General: He is not in acute distress.    Appearance: He is well-developed. He is obese. He is not ill-appearing, toxic-appearing or diaphoretic.  HENT:     Head: Normocephalic and atraumatic.     Nose: No rhinorrhea.     Mouth/Throat:     Mouth: Mucous membranes are moist. No oral lesions.     Pharynx: No oropharyngeal exudate, posterior oropharyngeal erythema or uvula swelling.  Eyes:     Extraocular Movements: Extraocular movements intact.     Pupils: Pupils are equal, round, and reactive to light.  Neck:     Thyroid: No thyromegaly.     Vascular: No hepatojugular reflux or JVD.     Trachea: No tracheal deviation.  Cardiovascular:     Rate and Rhythm: Normal rate and regular rhythm.     Heart sounds: Normal heart sounds. Heart sounds not distant. No murmur heard.    No friction rub.  Pulmonary:     Effort: Pulmonary effort is normal. No tachypnea, accessory muscle usage or respiratory distress.     Breath sounds: Normal breath sounds. No stridor. No decreased breath sounds.  Chest:     Chest wall: No mass, deformity, tenderness, crepitus or edema. There is no dullness to percussion.  Abdominal:     General: Bowel sounds are normal. There is no abdominal bruit.     Palpations: Abdomen is soft. There is no hepatomegaly, splenomegaly or mass.     Tenderness: There is no abdominal tenderness. There is no rebound.  Musculoskeletal:     Cervical back: Normal range of motion and neck supple.     Right lower leg: No edema.     Left lower leg: No edema.  Lymphadenopathy:     Cervical: No cervical adenopathy.  Skin:    General: Skin is warm.     Capillary Refill: Capillary refill takes less than 2 seconds.     Findings: No erythema.  Nails: There is no clubbing.  Neurological:     Mental Status: He is alert.      UC Treatments / Results  Labs (all labs ordered are listed, but only abnormal results are displayed) Labs Reviewed - No data to display  EKG   Radiology No results found.  Procedures Procedures (including critical care time)  Medications Ordered in UC Medications  alum & mag hydroxide-simeth (MAALOX/MYLANTA) 200-200-20 MG/5ML suspension 30 mL (30 mLs Oral Given 07/29/22 1421)    And  lidocaine (XYLOCAINE) 2 % viscous mouth solution 15 mL (15 mLs Oral Given 07/29/22 1421)    Initial Impression / Assessment and Plan / UC Course  I have reviewed the triage vital signs and the nursing notes.  Pertinent labs & imaging results that were available during my care of the patient were reviewed by me and considered in my medical decision making (see chart for details).     Atypical chest pain -EKG reviewed in office, appears consistent with prior EKG done in January.  Patient was given GI cocktail including lidocaine and Maalox.  Admits this resolved his symptoms.  I suspect given the intermittent nature he may be dealing with esophageal spasm secondary to uncontrolled GERD.  We will give him prescription for dicyclomine today.  Patient should consider follow-up with PCP.  If dicyclomine ineffective, calcium channel blocker may be a reasonable alternative that would also improve blood pressure. GERD -Will refill patient's pantoprazole as this was effective previously. Elevated blood pressure-please monitor at home, if remains elevated may need to consider antihypertensive medication.  Final Clinical Impressions(s) / UC Diagnoses   Final diagnoses:  Atypical chest pain  Gastroesophageal reflux disease, unspecified whether esophagitis present  Elevated blood pressure reading     Discharge Instructions      We are glad your symptoms are improving. Your EKG is similar to the EKG you had in January in the  ER. I suspect that your symptoms are related to esophageal spasms. Please start taking the medications as prescribed. Please call your PCP and schedule a follow-up in 10 to 14 days. Please also monitor your blood pressure at home, as it is elevated here today. This may need treatment. If you have any new or recurrent symptoms, please had to the emergency room.     ED Prescriptions     Medication Sig Dispense Auth. Provider   dicyclomine (BENTYL) 20 MG tablet Take 1 tablet (20 mg total) by mouth 4 (four) times daily -  before meals and at bedtime. As needed 30 tablet Marilena Trevathan L, PA   pantoprazole (PROTONIX) 20 MG tablet Take 1 tablet (20 mg total) by mouth daily. 30 tablet Taariq Leitz L, [place]      PDMP not reviewed this encounter.   Maretta Bees, [place] 07/29/22 1749

## 2022-07-29 NOTE — Discharge Instructions (Addendum)
We are glad your symptoms are improving. Your EKG is similar to the EKG you had in January in the ER. I suspect that your symptoms are related to esophageal spasms. Please start taking the medications as prescribed. Please call your PCP and schedule a follow-up in 10 to 14 days. Please also monitor your blood pressure at home, as it is elevated here today. This may need treatment. If you have any new or recurrent symptoms, please had to the emergency room.

## 2022-07-29 NOTE — ED Triage Notes (Signed)
Pt reports sore throat esp with swallowing 3 days ago. Reports that has pinching in chest when eats or drinks for past 3 days as well. Denies n/v.  Pt reports 4 days ago had left sided chest pains that radiated to left arm that last about 20 seconds.

## 2022-08-22 ENCOUNTER — Emergency Department
Admission: EM | Admit: 2022-08-22 | Discharge: 2022-08-22 | Disposition: A | Discharge status: Home / Self Care | Payer: Medicare Other | Attending: Emergency Medicine | Admitting: Emergency Medicine

## 2022-08-22 ENCOUNTER — Encounter: Payer: Self-pay | Admitting: Emergency Medicine

## 2022-08-22 ENCOUNTER — Other Ambulatory Visit: Payer: Self-pay

## 2022-08-22 DIAGNOSIS — K219 Gastro-esophageal reflux disease without esophagitis: Secondary | ICD-10-CM

## 2022-08-22 DIAGNOSIS — K2100 Gastro-esophageal reflux disease with esophagitis, without bleeding: Secondary | ICD-10-CM | POA: Diagnosis not present

## 2022-08-22 DIAGNOSIS — J029 Acute pharyngitis, unspecified: Secondary | ICD-10-CM | POA: Insufficient documentation

## 2022-08-22 DIAGNOSIS — R110 Nausea: Secondary | ICD-10-CM | POA: Diagnosis not present

## 2022-08-22 NOTE — ED Provider Notes (Signed)
[facility] Provider Note   Event Date/Time   First MD Initiated Contact with Patient 08/22/22 [phone]     (approximate) History  Sore Throat and Nausea  HPI Joseph Hendrix is a 36 y.o. male with past medical history of GERD who presents for sore throat, nausea, and epigastric abdominal pain that he noticed at approximately 3 AM last night and has been stable since onset.  Patient states that the nausea and epigastric pain are similar to symptoms of GERD that he has had in the past however the sore throat is a new symptom.  Patient denies any subjective fever/chills. ROS: Patient currently denies any vision changes, tinnitus, difficulty speaking, facial droop, sore throat, chest pain, shortness of breath, abdominal pain, nausea/vomiting/diarrhea, dysuria, or weakness/numbness/paresthesias in any extremity   Physical Exam  Triage Vital Signs: ED Triage Vitals  Enc Vitals Group     BP 08/22/22 0917 (!) 141/76     Pulse Rate 08/22/22 0917 96     Resp 08/22/22 0917 16     Temp 08/22/22 0917 99 F (37.2 C)     Temp Source 08/22/22 0917 Oral     SpO2 08/22/22 0917 97 %     Weight 08/22/22 0916 220 lb (99.8 kg)     Height 08/22/22 0916 6\' 2"  (1.88 m)     Head Circumference --      Peak Flow --      Pain Score 08/22/22 0916 4     Pain Loc --      Pain Edu? --      Excl. in GC? --    Most recent vital signs: Vitals:   08/22/22 0917  BP: (!) 141/76  Pulse: 96  Resp: 16  Temp: 99 F (37.2 C)  SpO2: 97%   General: Awake, oriented x4. CV:  Good peripheral perfusion.  Resp:  Normal effort.  Abd:  No distention.  Other:  Middle-aged overweight African-American male sitting in chair in exam room in no acute distress.  Erythematous and enlarged tonsil on the right without purulent drainage.  No airway compromise.  No stridor. ED Results / Procedures / Treatments  PROCEDURES: Critical Care performed: No Procedures MEDICATIONS ORDERED IN ED: Medications - No  data to display IMPRESSION / MDM / ASSESSMENT AND PLAN / ED COURSE  I reviewed the triage vital signs and the nursing notes.                             The patient is on the cardiac monitor to evaluate for evidence of arrhythmia and/or significant heart rate changes. Patient's presentation is most consistent with acute presentation with potential threat to life or bodily function. 33-year-old male presents for sore throat No history of immunocompromise. Nontoxic appearance. Patient euvolemic with no trismus. No airway compromise. No change in voice, exudates, enlarged lymph nodes. Able to tolerate PO. Given History and Exam I have low suspicion for this presentation being caused by PTA, RPA, Ludwigs angina, Epiglottitis or Bacterial Tracheitis, EBV, acute HIV, or Strep throat.  Given patient's history of GERD and reportedly sleeping on a couch recently, I am concerned that patient is having reflux of stomach acid into the posterior oropharynx causing inflammation of patient's tonsil as he does not have any other infectious symptoms including generalized weakness, subjective fever, or recent sick contacts/travel.  Patient encouraged to change his OTC GERD medication as well as lifestyle changes including elevating the head  of the bed and refraining from p.o. intake at least 2 hours prior to sleep.  Dispo: Discharge home   FINAL CLINICAL IMPRESSION(S) / ED DIAGNOSES   Final diagnoses:  Pharyngitis, unspecified etiology  Nausea  Gastroesophageal reflux disease, unspecified whether esophagitis present   Rx / DC Orders   ED Discharge Orders     None      Note:  This document was prepared using Dragon voice recognition software and may include unintentional dictation errors.   Naaman Plummer, MD 08/22/22 [phone]

## 2022-08-22 NOTE — ED Notes (Signed)
EDP at bedside  

## 2022-08-22 NOTE — ED Notes (Signed)
Pt complains of nausea for several months and a sore throat that started last night. Pt has been using Pepcid over the counter.

## 2022-08-22 NOTE — Discharge Instructions (Signed)
Please take over the counter pepcid (famotidine) before bedtime as well as refrain from eating at least 2 hours before laying down for sleep. If you find that you are still having symptoms of reflux during the day, you may start taking one dose at night and one in the morning with the addition of Tums (calcium carbonate) chewables as needed

## 2022-08-22 NOTE — ED Notes (Signed)
Dc instructions reviewed with pt no questions or concerns at this time will follow up as needed.  °

## 2022-08-22 NOTE — ED Triage Notes (Signed)
Pt to ED via POV c/o sore throat and nausea that started last night. Pt states that he took some tylenol last night but that did not help with the sore throat. Pt denies nasal drainage. Pt is in NAD.

## 2022-08-27 ENCOUNTER — Ambulatory Visit
Admission: EM | Admit: 2022-08-27 | Discharge: 2022-08-27 | Disposition: A | Discharge status: Home / Self Care | Payer: Medicare Other | Attending: Emergency Medicine | Admitting: Emergency Medicine

## 2022-08-27 ENCOUNTER — Encounter: Payer: Self-pay | Admitting: Emergency Medicine

## 2022-08-27 DIAGNOSIS — H1033 Unspecified acute conjunctivitis, bilateral: Secondary | ICD-10-CM | POA: Diagnosis not present

## 2022-08-27 MED ORDER — MOXIFLOXACIN HCL 0.5 % OP SOLN: 1.0000 [drp] | Freq: Three times a day (TID) | OPHTHALMIC | 0 refills | Status: AC

## 2022-08-27 NOTE — Discharge Instructions (Signed)
Instill 1 drop of Vigamox in each eye every 8 hours for the next 7 days for treatment of your conjunctivitis.  Avoid touching your eyes as much as possible.  Wipe down all surfaces, countertops, and doorknobs after the first and second 24 hours on eyedrops.  Wash her face with a clean wash rag to remove any drainage and use a different portion of the wash rag to clean each eye so as to not reinfect yourself.  Return for reevaluation for any new or worsening symptoms.  

## 2022-08-27 NOTE — ED Provider Notes (Signed)
[facility]    CSN: [id number] Arrival date & time: 08/27/22  2130      History   Chief Complaint Chief Complaint  Patient presents with   Eye Irritation     HPI Joseph Hendrix is a 36 y.o. male.   HPI  36 year old male here for evaluation of bilateral eye complaint.  Patient reports that he has been experiencing itching and irritation of both of his eyes for the last 2 days that is associated with a yellow mucousy discharge.  He denies any contact with other people with similar symptoms, changes in vision, possible to foreign bodies in his eye, and he does not wear contacts.  Past Medical History:  Diagnosis Date   Anxiety    Constipation 03/27/2019   Elevated BP without diagnosis of hypertension 08/09/2016   Gastroesophageal reflux disease without esophagitis 09/08/2016   History of gonorrhea    IBS (irritable bowel syndrome)    Musculoskeletal chest pain 03/27/2019    Patient Active Problem List   Diagnosis Date Noted   Chest pain at rest 07/08/2019   Constipation 03/27/2019   Musculoskeletal chest pain 03/27/2019   Gastroesophageal reflux disease without esophagitis 09/08/2016   Elevated BP without diagnosis of hypertension 08/09/2016    Past Surgical History:  Procedure Laterality Date   NO PAST SURGERIES         Home Medications    Prior to Admission medications   Medication Sig Start Date End Date Taking? Authorizing Provider  dicyclomine (BENTYL) 20 MG tablet Take 1 tablet (20 mg total) by mouth 4 (four) times daily -  before meals and at bedtime. As needed 07/29/22  Yes Crain, Whitney L, PA  moxifloxacin (VIGAMOX) 0.5 % ophthalmic solution Place 1 drop into both eyes 3 (three) times daily for 7 days. 08/27/22 09/03/22 Yes Margarette Canada, NP  pantoprazole (PROTONIX) 20 MG tablet Take 1 tablet (20 mg total) by mouth daily. 07/29/22  Yes Crain, Whitney L, PA  omeprazole (PRILOSEC) 20 MG capsule TK 1 C PO ONCE D 01/12/19 03/08/20  [provider]     Family History Family History  Problem Relation Age of Onset   Diabetes Mother    Hyperlipidemia Mother    Kidney failure Mother    Diabetes Father    Hyperlipidemia Father    Diabetes Brother    Hyperlipidemia Brother    Heart attack Neg Hx    Colon cancer Neg Hx    Esophageal cancer Neg Hx     Social History Social History   Tobacco Use   Smoking status: Never   Smokeless tobacco: Never  Vaping Use   Vaping Use: Never used  Substance Use Topics   Alcohol use: No   Drug use: No     Allergies   Patient has no known allergies.   Review of Systems Review of Systems  Eyes:  Positive for pain, discharge, redness and itching. Negative for photophobia and visual disturbance.     Physical Exam Triage Vital Signs ED Triage Vitals  Enc Vitals Group     BP 08/27/22 0955 (!) 147/98     Pulse Rate 08/27/22 0955 91     Resp 08/27/22 0955 16     Temp 08/27/22 0955 98.8 F (37.1 C)     Temp Source 08/27/22 0955 Oral     SpO2 08/27/22 0955 96 %     Weight --      Height --      Head Circumference --  Peak Flow --      Pain Score 08/27/22 0954 4     Pain Loc --      Pain Edu? --      Excl. in [place]? --    No data found.  Updated Vital Signs BP (!) 147/98 (BP Location: Right Arm)   Pulse 91   Temp 98.8 F (37.1 C) (Oral)   Resp 16   SpO2 96%   Visual Acuity Right Eye Distance:   Left Eye Distance:   Bilateral Distance:    Right Eye Near:   Left Eye Near:    Bilateral Near:     Physical Exam Vitals and nursing note reviewed.  Constitutional:      Appearance: Normal appearance.  Eyes:     General: No scleral icterus.       Right eye: Discharge present.        Left eye: Discharge present.    Extraocular Movements: Extraocular movements intact.     Pupils: Pupils are equal, round, and reactive to light.     Comments: Bulbar and labral conjunctiva are erythematous and injected bilaterally.  There is mucopurulent discharge in the left inner  canthus.  Normal red light reflex in both eyes.  Pupils are equal and reactive and EOMs intact.  Visual acuity shows OU 20/20, OD 20/20, OS 20/20.  Neurological:     Mental Status: He is alert.      UC Treatments / Results  Labs (all labs ordered are listed, but only abnormal results are displayed) Labs Reviewed - No data to display  EKG   Radiology No results found.  Procedures Procedures (including critical care time)  Medications Ordered in UC Medications - No data to display  Initial Impression / Assessment and Plan / UC Course  I have reviewed the triage vital signs and the nursing notes.  Pertinent labs & imaging results that were available during my care of the patient were reviewed by me and considered in my medical decision making (see chart for details).   Patient is a pleasant, nontoxic-appearing 36 year old male here for evaluation of redness, itching, and discharge of both eyes that started 2 days ago.  Patient has erythema and injection of bulbar and labral conjunctiva bilaterally with yellow mucopurulent discharge in the left inner canthus that is consistent with conjunctivitis.  Given the mucopurulence of the discharge suspect that it is bacterial in origin and I will treat the patient with Vigamox 3 times daily x7 days.  ER return precautions reviewed.   Final Clinical Impressions(s) / UC Diagnoses   Final diagnoses:  Acute bacterial conjunctivitis of both eyes     Discharge Instructions      Instill 1 drop of Vigamox in each eye every 8 hours for the next 7 days for treatment of your conjunctivitis.  Avoid touching your eyes as much as possible.  Wipe down all surfaces, countertops, and doorknobs after the first and second 24 hours on eyedrops.  Wash her face with a clean wash rag to remove any drainage and use a different portion of the wash rag to clean each eye so as to not reinfect yourself.  Return for reevaluation for any new or worsening  symptoms.      ED Prescriptions     Medication Sig Dispense Auth. Provider   moxifloxacin (VIGAMOX) 0.5 % ophthalmic solution Place 1 drop into both eyes 3 (three) times daily for 7 days. 3 mL Becky Augusta, NP      PDMP  not reviewed this encounter.   Becky Augusta, NP 08/27/22 1034

## 2022-08-27 NOTE — ED Triage Notes (Signed)
Pt presents with bilateral eye redness and irritation x 2 days

## 2022-09-07 ENCOUNTER — Emergency Department
Admission: EM | Admit: 2022-09-07 | Discharge: 2022-09-07 | Disposition: A | Discharge status: Home / Self Care | Payer: Medicare Other | Attending: Student in an Organized Health Care Education/Training Program | Admitting: Student in an Organized Health Care Education/Training Program

## 2022-09-07 ENCOUNTER — Other Ambulatory Visit: Payer: Self-pay

## 2022-09-07 ENCOUNTER — Encounter: Payer: Self-pay | Admitting: Emergency Medicine

## 2022-09-07 DIAGNOSIS — Z20822 Contact with and (suspected) exposure to covid-19: Secondary | ICD-10-CM | POA: Diagnosis not present

## 2022-09-07 DIAGNOSIS — J029 Acute pharyngitis, unspecified: Secondary | ICD-10-CM

## 2022-09-07 LAB — RESP PANEL BY RT-PCR (FLU A&B, COVID) ARPGX2
Influenza A by PCR: NEGATIVE
Influenza B by PCR: NEGATIVE
SARS Coronavirus 2 by RT PCR: NEGATIVE

## 2022-09-07 LAB — GROUP A STREP BY PCR: Group A Strep by PCR: NOT DETECTED

## 2022-09-07 MED ORDER — AMOXICILLIN-POT CLAVULANATE 500-125 MG PO TABS: 1.0000 | ORAL_TABLET | Freq: Two times a day (BID) | ORAL | 0 refills | Status: DC

## 2022-09-07 MED ORDER — DEXAMETHASONE 4 MG PO TABS
8.0000 mg | ORAL_TABLET | Freq: Once | ORAL | Status: AC
Start: 2022-09-07 — End: 2022-09-07
  Administered 2022-09-07: 8 mg via ORAL
  Filled 2022-09-07: qty 2

## 2022-09-07 NOTE — ED Provider Notes (Signed)
[facility] Provider Note    Event Date/Time   First MD Initiated Contact with Patient 09/07/22 1358     (approximate)   History   Sore Throat   HPI  Joseph Hendrix is a 36 y.o. male sore throat times several days.  Does have pain and discomfort with swallowing but normal phonation no shortness of breath has had some congestion.  No recent antibiotics.     Physical Exam   Triage Vital Signs: ED Triage Vitals  Enc Vitals Group     BP 09/07/22 1325 (!) 167/105     Pulse Rate 09/07/22 1325 (!) 103     Resp 09/07/22 1325 16     Temp 09/07/22 1325 99 F (37.2 C)     Temp Source 09/07/22 1325 Oral     SpO2 09/07/22 1325 93 %     Weight 09/07/22 1326 219 lb 12.8 oz (99.7 kg)     Height 09/07/22 1326 6\' 2"  (1.88 m)     Head Circumference --      Peak Flow --      Pain Score 09/07/22 1326 5     Pain Loc --      Pain Edu? --      Excl. in [place]? --     Most recent vital signs: Vitals:   09/07/22 1325  BP: (!) 167/105  Pulse: (!) 103  Resp: 16  Temp: 99 F (37.2 C)  SpO2: 93%     Constitutional: Alert  Eyes: Conjunctivae are normal.  Head: Atraumatic. Nose: No congestion/rhinnorhea. Mouth/Throat: Mucous membranes are moist.  Bilateral tonsillar erythema with exudates.  Uvula is midline.  No trismus. Neck: Painless ROM.  Cardiovascular:   Good peripheral circulation. Respiratory: Normal respiratory effort.  No retractions.  Gastrointestinal: Soft and nontender.  Musculoskeletal:  no deformity Neurologic:  MAE spontaneously. No gross focal neurologic deficits are appreciated.  Skin:  Skin is warm, dry and intact. No rash noted. Psychiatric: Mood and affect are normal. Speech and behavior are normal.    ED Results / Procedures / Treatments   Labs (all labs ordered are listed, but only abnormal results are displayed) Labs Reviewed  RESP PANEL BY RT-PCR (FLU A&B, COVID) [id number]  GROUP A STREP BY PCR      EKG      PROCEDURES:  Critical Care performed: No  Procedures   MEDICATIONS ORDERED IN ED: Medications  dexamethasone (DECADRON) tablet 8 mg (8 mg Oral Given 09/07/22 1440)     IMPRESSION / MDM / ASSESSMENT AND PLAN / ED COURSE  I reviewed the triage vital signs and the nursing notes.                              Differential diagnosis includes, but is not limited to, pharyngitis, sinusitis, PTA, RPA, viral illness  Patient presented to the ER for evaluation of sore throat as described above.  Does appear consistent with acute pharyngitis does have exudates.  Strep test ordered out of triage negative COVID-negative but based on symptoms and his clinical presentation I am going to treat for pharyngitis with Decadron as well as antibiotic.  Discussed return precautions.  Patient agreeable to plan.       FINAL CLINICAL IMPRESSION(S) / ED DIAGNOSES   Final diagnoses:  Acute pharyngitis, unspecified etiology     Rx / DC Orders   ED Discharge Orders  Ordered    amoxicillin-clavulanate (AUGMENTIN) 500-125 MG tablet  2 times daily        09/07/22 1430             Note:  This document was prepared using Dragon voice recognition software and may include unintentional dictation errors.    Merlyn Lot, MD 09/07/22 [phone]

## 2022-09-07 NOTE — ED Provider Triage Note (Signed)
Emergency Medicine Provider Triage Evaluation Note  Joseph Hendrix , a 36 y.o. male  was evaluated in triage.  Pt complains of sore throat and congestion for 2 weeks, no fever or chills, no cp/sob.  Review of Systems  Positive:  Negative:   Physical Exam  BP (!) 167/105 (BP Location: Right Arm)   Pulse (!) 103   Temp 99 F (37.2 C) (Oral)   Resp 16   Ht 6\' 2"  (1.88 m)   Wt 99.7 kg   SpO2 93%   BMI 28.22 kg/m  Gen:   Awake, no distress   Resp:  Normal effort  MSK:   Moves extremities without difficulty Other:  Right tonsil red and swollen, neck supple no lymph  Medical Decision Making  Medically screening exam initiated at 1:28 PM.  Appropriate orders placed.  Joseph Hendrix was informed that the remainder of the evaluation will be completed by another provider, this initial triage assessment does not replace that evaluation, and the importance of remaining in the ED until their evaluation is complete.  Covid and strep ordered   Joseph Starks, PA-C 09/07/22 1329

## 2022-09-07 NOTE — ED Triage Notes (Signed)
C/O nasal congestion, cough, sore throat x 2 weeks ago.  No change.  Denies fever/chills.

## 2022-11-27 IMAGING — CR DG CHEST 2V
1 series · 2 of 2 positions shown · non-contrast
Comparison: 03/06/2020

CLINICAL DATA: Left side chest pain

EXAM:
CHEST - 2 VIEW

[Series 1: dg chest 2 view · 0.14mm/px · 2 of 2 slices shown]
[im 1/2]
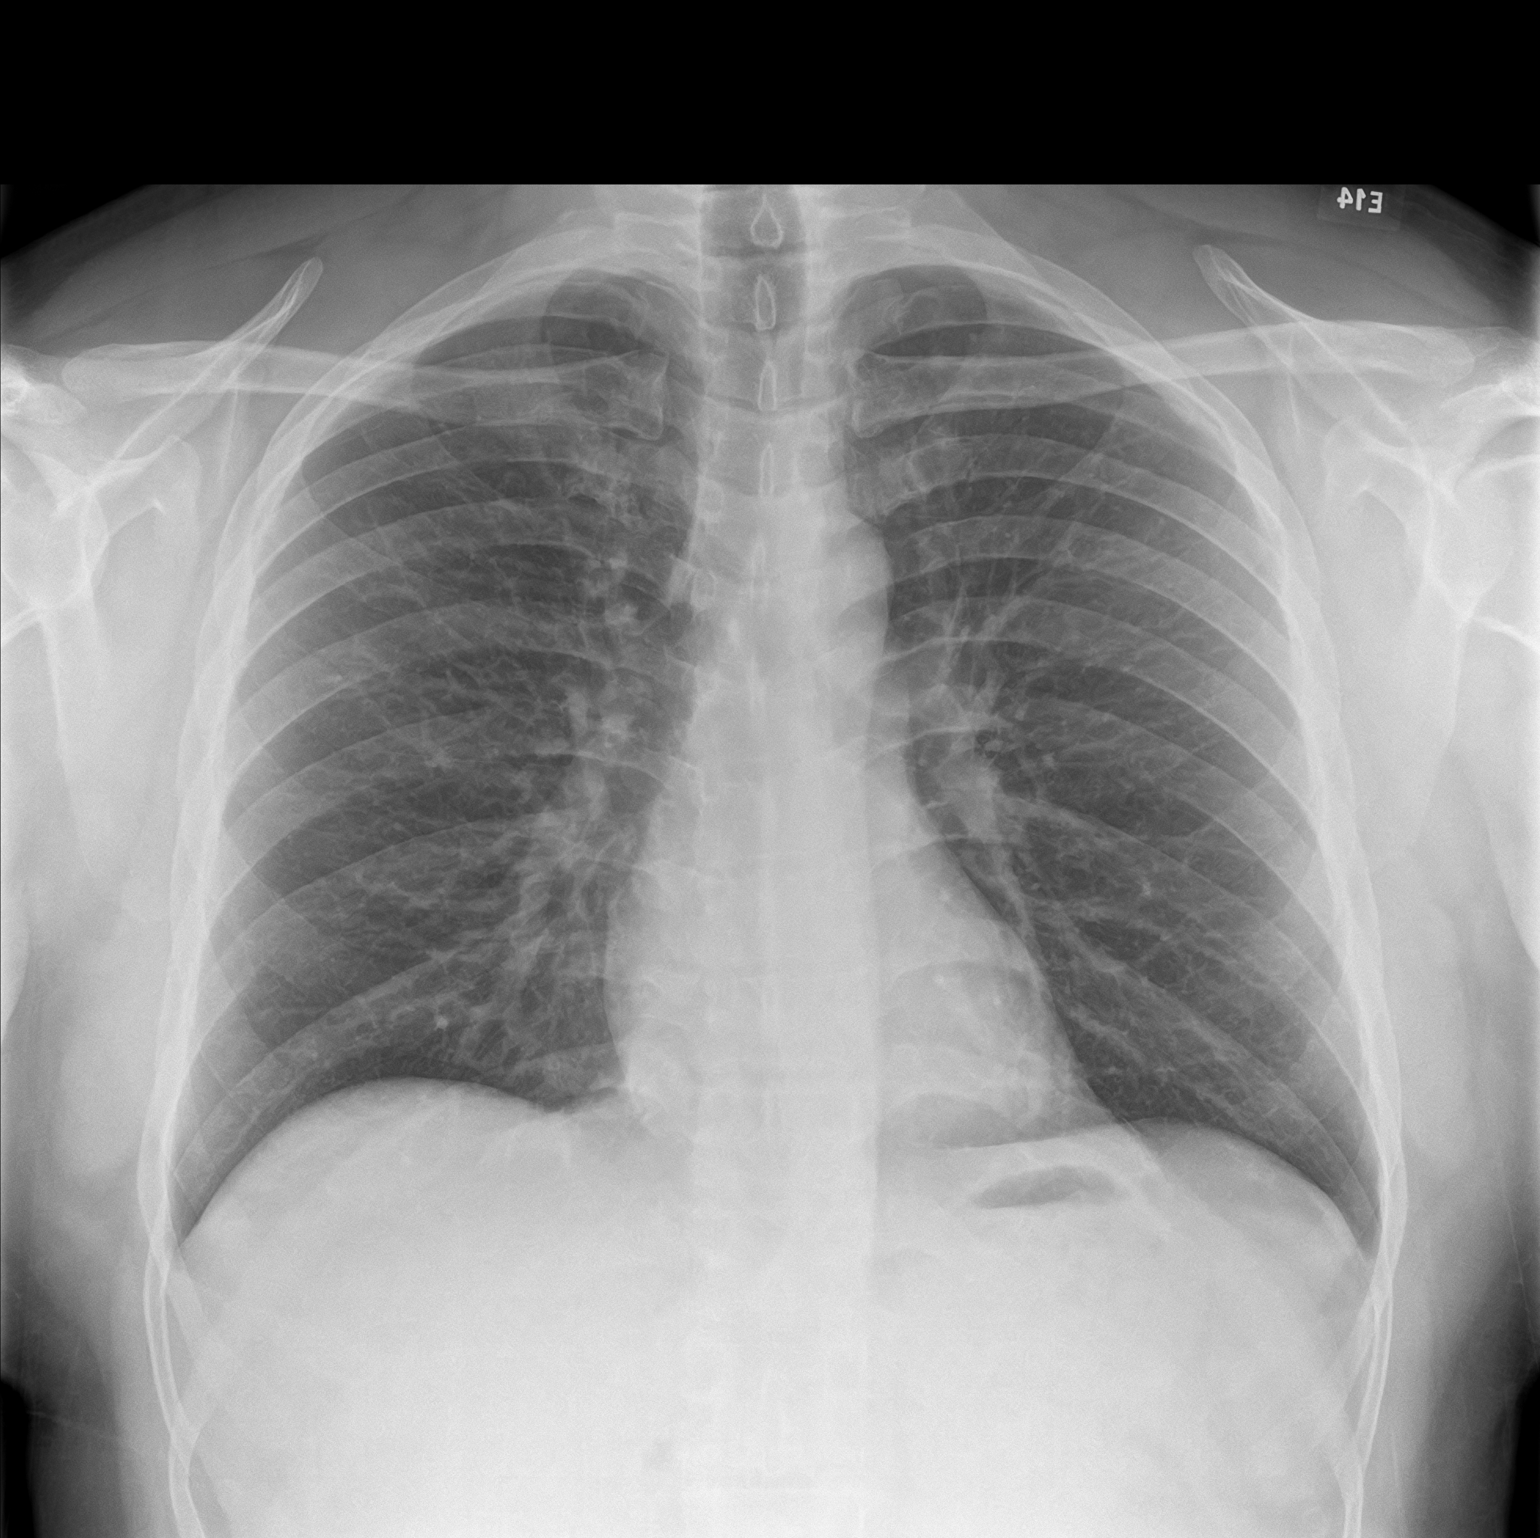
[im 2/2]
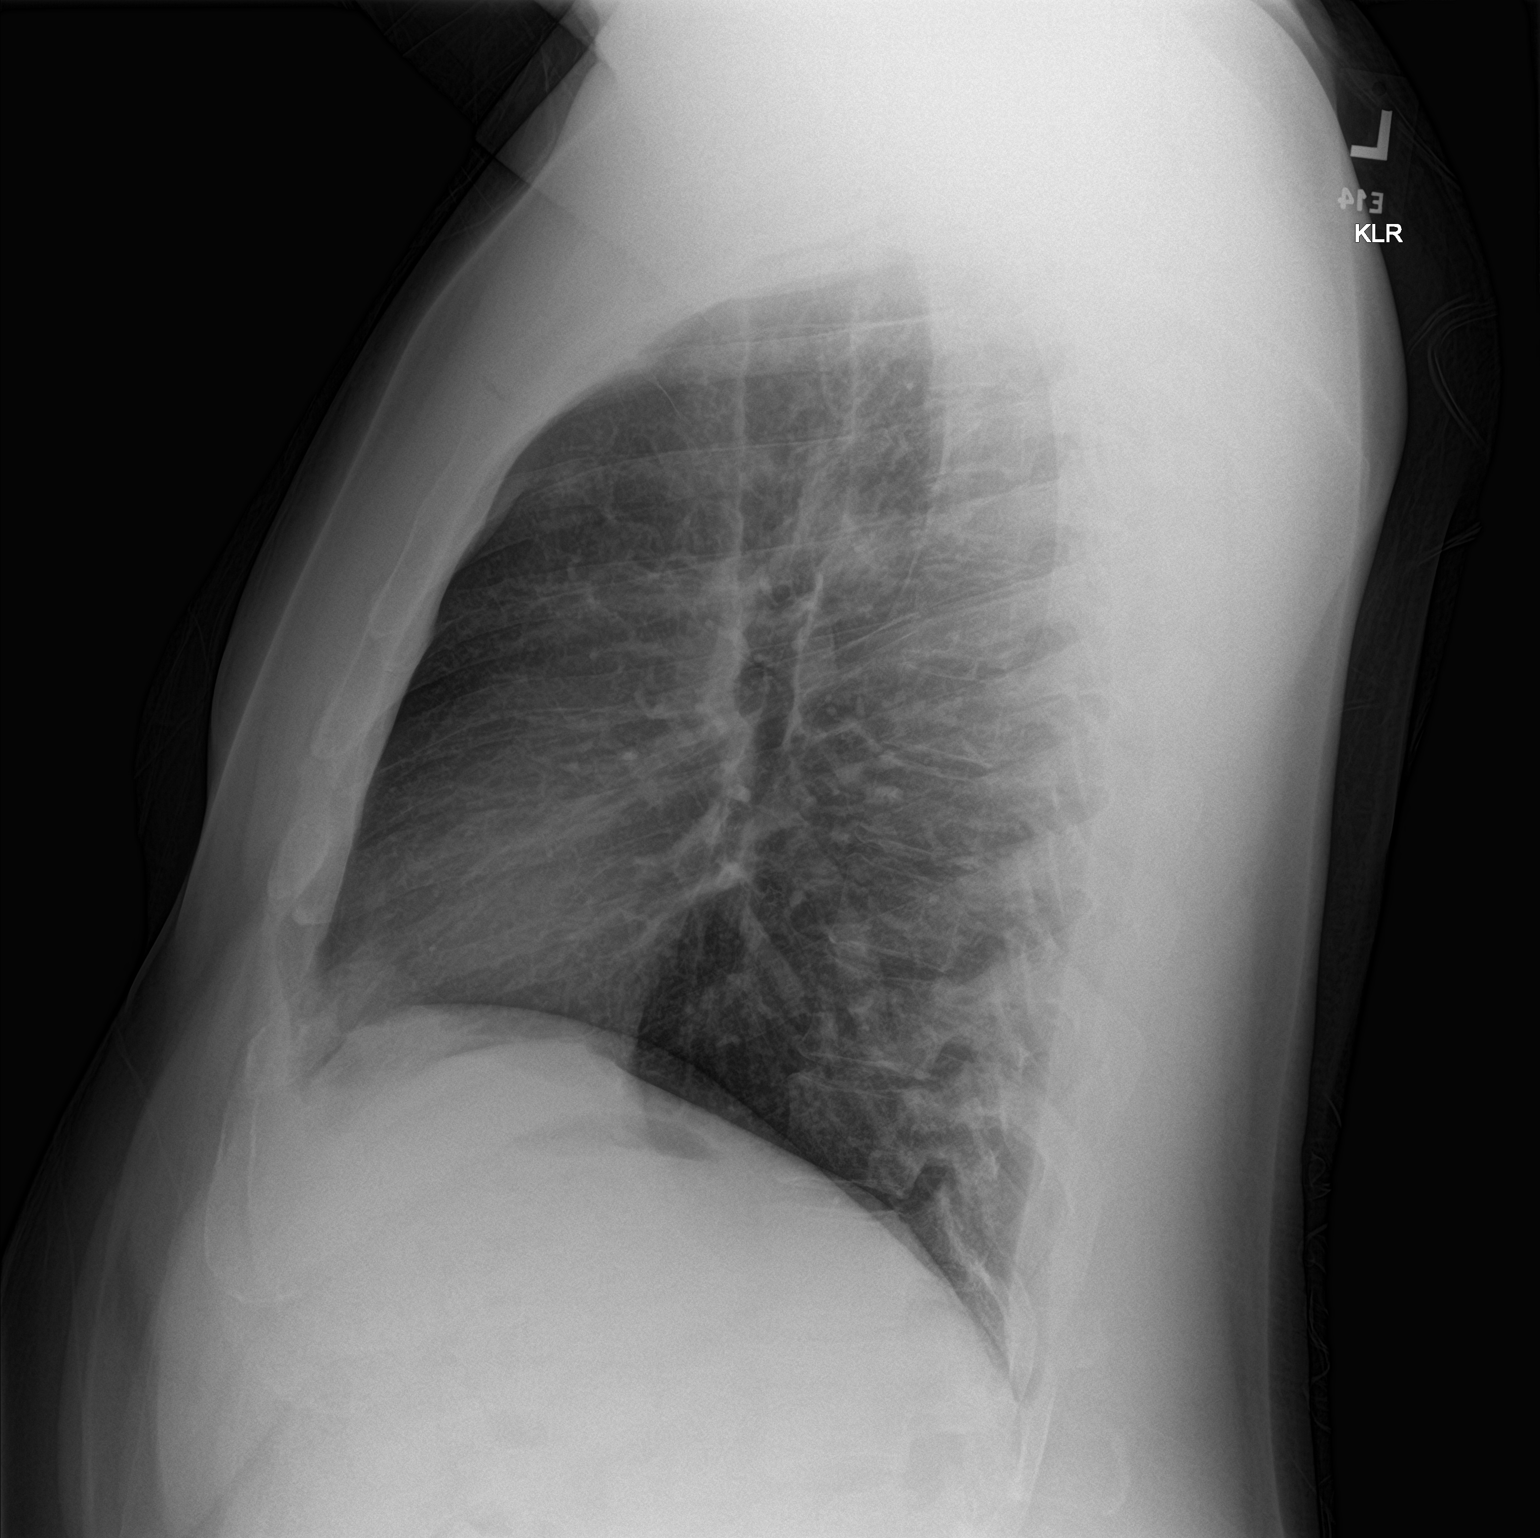

[2 of 2 positions shown; findings below may reference images not displayed]

FINDINGS: The heart size and mediastinal contours are within normal limits.
Both lungs are clear. The visualized skeletal structures are
unremarkable.
IMPRESSION: Negative

## 2022-12-23 ENCOUNTER — Encounter: Payer: Self-pay | Admitting: Emergency Medicine

## 2022-12-23 ENCOUNTER — Emergency Department: Payer: 59

## 2022-12-23 ENCOUNTER — Other Ambulatory Visit: Payer: Self-pay

## 2022-12-23 ENCOUNTER — Emergency Department
Admission: EM | Admit: 2022-12-23 | Discharge: 2022-12-23 | Disposition: A | Discharge status: Home / Self Care | Payer: 59 | Attending: Emergency Medicine | Admitting: Emergency Medicine

## 2022-12-23 DIAGNOSIS — R0789 Other chest pain: Secondary | ICD-10-CM | POA: Diagnosis present

## 2022-12-23 DIAGNOSIS — R1011 Right upper quadrant pain: Secondary | ICD-10-CM | POA: Diagnosis not present

## 2022-12-23 DIAGNOSIS — R079 Chest pain, unspecified: Secondary | ICD-10-CM

## 2022-12-23 LAB — BASIC METABOLIC PANEL
Anion gap: 1 — ABNORMAL LOW (ref 5–15)
BUN: 10 mg/dL (ref 6–20)
CO2: 28 mmol/L (ref 22–32)
Calcium: 8.5 mg/dL — ABNORMAL LOW (ref 8.9–10.3)
Chloride: 106 mmol/L (ref 98–111)
Creatinine, Ser: 0.9 mg/dL (ref 0.61–1.24)
GFR, Estimated: >60 mL/min (ref >60)
Glucose, Bld: 108 mg/dL — ABNORMAL HIGH (ref 70–99)
Potassium: 3.7 mmol/L (ref 3.5–5.1)
Sodium: 135 mmol/L (ref 135–145)

## 2022-12-23 LAB — CBC
HCT: 45.1 % (ref 39.0–52.0)
Hemoglobin: 14.6 g/dL (ref 13.0–17.0)
MCH: 27.9 pg (ref 26.0–34.0)
MCHC: 32.4 g/dL (ref 30.0–36.0)
MCV: 86.1 fL (ref 80.0–100.0)
Platelets: 255 10*3/uL (ref 150–400)
RBC: 5.24 MIL/uL (ref 4.22–5.81)
RDW: 13.1 % (ref 11.5–15.5)
WBC: 6.2 10*3/uL (ref 4.0–10.5)
nRBC: 0 % (ref 0.0–0.2)

## 2022-12-23 LAB — TROPONIN I (HIGH SENSITIVITY): Troponin I (High Sensitivity): <2 ng/L (ref <18)

## 2022-12-23 NOTE — ED Provider Notes (Signed)
[facility] Provider Note    Event Date/Time   First MD Initiated Contact with Patient 12/23/22 1332     (approximate)   History   Chest Pain   HPI  Joseph Hendrix is a 37 y.o. male  with pmh obesity, GERD, IBS who p/w chest pain.  Patient had chest pain 3 days ago.  He was sitting down when he developed a dull pain in the left side of his chest with some associated pain in the left arm.  He also felt some pain in the right upper quadrant.  Lasted for about 10 seconds and then resolved.  Has had some intermittent stomach pain since then but none today denies any chest pain since that 1 episode.  Denies shortness of breath or pain with exertion.  Notes lower extremity swelling no history of DVT/PE denies drug or alcohol use.  No cardiac history.  Did have chest pain about a year ago was seen in the ED with negative troponins and negative D-dimer.  Denies family history of coronary disease that he knows of.     Past Medical History:  Diagnosis Date   Anxiety    Constipation 03/27/2019   Elevated BP without diagnosis of hypertension 08/09/2016   Gastroesophageal reflux disease without esophagitis 09/08/2016   History of gonorrhea    IBS (irritable bowel syndrome)    Musculoskeletal chest pain 03/27/2019    Patient Active Problem List   Diagnosis Date Noted   Chest pain at rest 07/08/2019   Constipation 03/27/2019   Musculoskeletal chest pain 03/27/2019   Gastroesophageal reflux disease without esophagitis 09/08/2016   Elevated BP without diagnosis of hypertension 08/09/2016     Physical Exam  Triage Vital Signs: ED Triage Vitals  Enc Vitals Group     BP 12/23/22 1300 132/81     Pulse Rate 12/23/22 1300 93     Resp 12/23/22 1300 16     Temp 12/23/22 1300 98.4 F (36.9 C)     Temp Source 12/23/22 1300 Oral     SpO2 12/23/22 1300 97 %     Weight 12/23/22 1255 220 lb (99.8 kg)     Height 12/23/22 1255 6\' 1"  (1.854 m)     Head Circumference --       Peak Flow --      Pain Score 12/23/22 1254 5     Pain Loc --      Pain Edu? --      Excl. in [place]? --     Most recent vital signs: Vitals:   12/23/22 1300  BP: 132/81  Pulse: 93  Resp: 16  Temp: 98.4 F (36.9 C)  SpO2: 97%     General: Awake, no distress.  CV:  Good peripheral perfusion.  Resp:  Normal effort.  Lungs are clear no increased work of breathing Abd:  No distention.  Abdomen is soft and nontender throughout Neuro:             Awake, Alert, Oriented x 3  Other:     ED Results / Procedures / Treatments  Labs (all labs ordered are listed, but only abnormal results are displayed) Labs Reviewed  BASIC METABOLIC PANEL - Abnormal; Notable for the following components:      Result Value   Glucose, Bld 108 (*)    Calcium 8.5 (*)    Anion gap 1 (*)    All other components within normal limits  CBC  TROPONIN I (HIGH SENSITIVITY)  EKG  EKG reviewed interpreted myself shows normal sinus rhythm normal axis normal intervals T wave inversions in the lateral precordial leads V3 through V6 subtle ST depression in the inferior leads similar to prior EKG   RADIOLOGY I reviewed and interpreted the CXR which does not show any acute cardiopulmonary process    PROCEDURES:  Critical Care performed: No  Procedures  MEDICATIONS ORDERED IN ED: Medications - No data to display   IMPRESSION / MDM / [place] / ED COURSE  I reviewed the triage vital signs and the nursing notes.                              Patient's presentation is most consistent with acute complicated illness / injury requiring diagnostic workup.  Differential diagnosis includes, but is not limited to, musculoskeletal pain, precordial catch, GI related pain, biliary colic, less likely ACS or PE  The patient is a 37 year old male presents after an episode of chest pain 3 days ago.  [place] sitting down was dull lasted for 10 seconds and was associated with some abdominal pain.  Has not  had symptoms since was just concerned about the pain he did have.  Today's vitals are reassuring he looks well on exam lungs are clear no signs of DVT and abdominal exam is benign.  His EKG does have some features that are concerning such as some subtle ST depression in the inferior leads as well as T wave inversions V3 through V6 however this is similar to prior EKG and is unchanged.  Overall I am reassured that he has not had pain 3 days and suspect that this is benign because of chest pain we will check 1 troponin and if negative he can be discharged to follow-up with PCP.  Labs are reassuring including negative troponin.  Given patient is having no ongoing pain with no EKG changes I think he is appropriate for discharge with PCP follow-up.     FINAL CLINICAL IMPRESSION(S) / ED DIAGNOSES   Final diagnoses:  Chest pain, unspecified type     Rx / DC Orders   ED Discharge Orders     None        Note:  This document was prepared using Dragon voice recognition software and may include unintentional dictation errors.   Rada Hay, MD 12/23/22 [phone]

## 2022-12-23 NOTE — ED Triage Notes (Signed)
Pt via POV from home. Pt c/o L sided chest pain that started 2-3 days ago. Pt reports also a cough. Pt states the pain is dull. Denies SOB. Pt is A&Ox4 and NAD

## 2022-12-23 NOTE — Discharge Instructions (Addendum)
Your EKG blood work and chest x-ray are all reassuring.  Please follow-up with your primary care doctor.

## 2023-02-09 ENCOUNTER — Emergency Department
Admission: EM | Admit: 2023-02-09 | Discharge: 2023-02-09 | Disposition: A | Discharge status: Home / Self Care | Payer: 59 | Attending: Emergency Medicine | Admitting: Emergency Medicine

## 2023-02-09 ENCOUNTER — Encounter: Payer: Self-pay | Admitting: Intensive Care

## 2023-02-09 ENCOUNTER — Emergency Department: Payer: 59

## 2023-02-09 ENCOUNTER — Other Ambulatory Visit: Payer: Self-pay

## 2023-02-09 DIAGNOSIS — E119 Type 2 diabetes mellitus without complications: Secondary | ICD-10-CM | POA: Diagnosis not present

## 2023-02-09 DIAGNOSIS — K2901 Acute gastritis with bleeding: Secondary | ICD-10-CM | POA: Diagnosis not present

## 2023-02-09 DIAGNOSIS — R109 Unspecified abdominal pain: Secondary | ICD-10-CM | POA: Diagnosis present

## 2023-02-09 HISTORY — DX: Type 2 diabetes mellitus without complications: E11.9

## 2023-02-09 LAB — CBC
HCT: 39.5 % (ref 39.0–52.0)
Hemoglobin: 12.7 g/dL — ABNORMAL LOW (ref 13.0–17.0)
MCH: 27.7 pg (ref 26.0–34.0)
MCHC: 32.2 g/dL (ref 30.0–36.0)
MCV: 86.1 fL (ref 80.0–100.0)
Platelets: 262 10*3/uL (ref 150–400)
RBC: 4.59 MIL/uL (ref 4.22–5.81)
RDW: 13 % (ref 11.5–15.5)
WBC: 6.5 10*3/uL (ref 4.0–10.5)
nRBC: 0 % (ref 0.0–0.2)

## 2023-02-09 LAB — HEPATIC FUNCTION PANEL
ALT: 14 U/L (ref 0–44)
AST: 18 U/L (ref 15–41)
Albumin: 3.7 g/dL (ref 3.5–5.0)
Alkaline Phosphatase: 67 U/L (ref 38–126)
Bilirubin, Direct: <0.1 mg/dL (ref 0.0–0.2)
Total Bilirubin: 0.6 mg/dL (ref 0.3–1.2)
Total Protein: 7.2 g/dL (ref 6.5–8.1)

## 2023-02-09 LAB — BASIC METABOLIC PANEL
Anion gap: 4 — ABNORMAL LOW (ref 5–15)
BUN: 19 mg/dL (ref 6–20)
CO2: 24 mmol/L (ref 22–32)
Calcium: 8.8 mg/dL — ABNORMAL LOW (ref 8.9–10.3)
Chloride: 104 mmol/L (ref 98–111)
Creatinine, Ser: 0.74 mg/dL (ref 0.61–1.24)
GFR, Estimated: >60 mL/min (ref >60)
Glucose, Bld: 158 mg/dL — ABNORMAL HIGH (ref 70–99)
Potassium: 3.7 mmol/L (ref 3.5–5.1)
Sodium: 132 mmol/L — ABNORMAL LOW (ref 135–145)

## 2023-02-09 LAB — TROPONIN I (HIGH SENSITIVITY): Troponin I (High Sensitivity): <2 ng/L (ref <18)

## 2023-02-09 LAB — LIPASE, BLOOD: Lipase: 29 U/L (ref 11–51)

## 2023-02-09 MED ORDER — PANTOPRAZOLE SODIUM 40 MG PO TBEC
40.0000 mg | DELAYED_RELEASE_TABLET | Freq: Once | ORAL | Status: AC
  Administered 2023-02-09: 40 mg via ORAL
  Filled 2023-02-09: qty 1

## 2023-02-09 MED ORDER — PANTOPRAZOLE SODIUM 40 MG IV SOLR: 40.0000 mg | Freq: Once | INTRAVENOUS | Status: DC

## 2023-02-09 MED ORDER — ALUM & MAG HYDROXIDE-SIMETH 200-200-20 MG/5ML PO SUSP
30.0000 mL | Freq: Once | ORAL | Status: AC
  Administered 2023-02-09: 30 mL via ORAL
  Filled 2023-02-09: qty 30

## 2023-02-09 MED ORDER — LIDOCAINE VISCOUS HCL 2 % MT SOLN
15.0000 mL | Freq: Once | OROMUCOSAL | Status: AC
  Administered 2023-02-09: 15 mL via ORAL
  Filled 2023-02-09: qty 15

## 2023-02-09 MED ORDER — PANTOPRAZOLE SODIUM 40 MG PO TBEC: 40.0000 mg | DELAYED_RELEASE_TABLET | Freq: Two times a day (BID) | ORAL | 2 refills | Status: AC

## 2023-02-09 NOTE — ED Provider Notes (Signed)
[facility] Provider Note    Event Date/Time   First MD Initiated Contact with Patient 02/09/23 1102     (approximate)   History   Chief Complaint Abdominal Pain and Chest Pain   HPI  Joseph Hendrix is a 37 y.o. male with past medical history of diabetes, GERD, and IBS who presents to the ED complaining of chest pain.  Patient reports that last night when he was going to bed he began having pain in his upper abdomen, worse when he went to lay flat at night.  He describes this as a sharp pain that has moved up into the left side of his chest, present constantly overnight.  He denies any nausea, vomiting, or diarrhea.  He has not had any fevers, cough, or shortness of breath, denies any pain or swelling in his legs.  He reports similar episodes of pain in the past that have typically went away on their own, but he continues to have pain this morning.     Physical Exam   Triage Vital Signs: ED Triage Vitals  Enc Vitals Group     BP 02/09/23 1057 (!) 140/94     Pulse Rate 02/09/23 1057 (!) 113     Resp 02/09/23 1057 16     Temp 02/09/23 1057 98.6 F (37 C)     Temp Source 02/09/23 1057 Oral     SpO2 02/09/23 1057 99 %     Weight 02/09/23 1058 249 lb (112.9 kg)     Height 02/09/23 1058 6\' 1"  (1.854 m)     Head Circumference --      Peak Flow --      Pain Score 02/09/23 1057 5     Pain Loc --      Pain Edu? --      Excl. in [place]? --     Most recent vital signs: Vitals:   02/09/23 1057  BP: (!) 140/94  Pulse: (!) 113  Resp: 16  Temp: 98.6 F (37 C)  SpO2: 99%    Constitutional: Alert and oriented. Eyes: Conjunctivae are normal. Head: Atraumatic. Nose: No congestion/rhinnorhea. Mouth/Throat: Mucous membranes are moist.  Cardiovascular: Normal rate, regular rhythm. Grossly normal heart sounds.  2+ radial pulses bilaterally. Respiratory: Normal respiratory effort.  No retractions. Lungs CTAB.  No chest wall tenderness to  palpation. Gastrointestinal: Soft and nontender. No distention. Musculoskeletal: No lower extremity tenderness nor edema.  Neurologic:  Normal speech and language. No gross focal neurologic deficits are appreciated.    ED Results / Procedures / Treatments   Labs (all labs ordered are listed, but only abnormal results are displayed) Labs Reviewed  BASIC METABOLIC PANEL - Abnormal; Notable for the following components:      Result Value   Sodium 132 (*)    Glucose, Bld 158 (*)    Calcium 8.8 (*)    Anion gap 4 (*)    All other components within normal limits  CBC - Abnormal; Notable for the following components:   Hemoglobin 12.7 (*)    All other components within normal limits  HEPATIC FUNCTION PANEL  LIPASE, BLOOD  TROPONIN I (HIGH SENSITIVITY)     EKG  ED ECG REPORT I, Blake Divine, the attending physician, personally viewed and interpreted this ECG.   Date: 02/09/2023  EKG Time: 10:59  Rate: 106  Rhythm: sinus tachycardia  Axis: Normal  Intervals:none  ST&T Change: Nonspecific T wave abnormality  RADIOLOGY Chest x-ray reviewed and interpreted by me  with no infiltrate, edema, or effusion.  PROCEDURES:  Critical Care performed: No  Procedures   MEDICATIONS ORDERED IN ED: Medications  pantoprazole (PROTONIX) injection 40 mg (has no administration in time range)  alum & mag hydroxide-simeth (MAALOX/MYLANTA) 200-200-20 MG/5ML suspension 30 mL (30 mLs Oral Given 02/09/23 1146)    And  lidocaine (XYLOCAINE) 2 % viscous mouth solution 15 mL (15 mLs Oral Given 02/09/23 1146)     IMPRESSION / MDM / ASSESSMENT AND PLAN / ED COURSE  I reviewed the triage vital signs and the nursing notes.                              37 y.o. male with past medical history of GERD, diabetes, and IBS who presents to the ED complaining of pain in his upper abdomen moving into his chest overnight, worse when lying flat.  Patient's presentation is most consistent with acute  presentation with potential threat to life or bodily function.  Differential diagnosis includes, but is not limited to, ACS, PE, pneumonia, pneumothorax, GERD, musculoskeletal pain, anxiety, gastritis, hepatitis, pancreatitis, cholecystitis.  Patient well-appearing and in no acute distress, vital signs remarkable for tachycardia but otherwise reassuring.  He has a benign abdominal exam, EKG shows no evidence of arrhythmia or ischemia.  Symptoms seem atypical for ACS or PE, description seems most consistent with gastritis and GERD.  We will treat with GI cocktail, lab results are pending at this time.  Chest x-ray is unremarkable.  Lab results without acute electrolyte abnormality or AKI, LFTs and lipase are unremarkable.  Troponin within normal limits and I doubt ACS or PE.  Patient does have slightly downtrending hemoglobin of 2 points compared to a month and a half ago.  He does report a dark bowel movement yesterday and one earlier today.  Rectal exam performed and shows dark guaiac positive stool, patient may have some mild GI bleeding from gastritis.  Findings discussed with Dr. Virgina Jock of GI and patient was offered admission for endoscopy tomorrow.  He declines and prefers to follow-up as an outpatient, which Dr. Virgina Jock and I agree is reasonable given no signs of severe bleeding.  We will start him on a PPI and he was counseled to avoid NSAIDs, counseled to return to the ED for new or worsening symptoms, patient agrees with plan.      FINAL CLINICAL IMPRESSION(S) / ED DIAGNOSES   Final diagnoses:  Acute gastritis with hemorrhage, unspecified gastritis type     Rx / DC Orders   ED Discharge Orders          Ordered    pantoprazole (PROTONIX) 40 MG tablet  2 times daily before meals        02/09/23 1336             Note:  This document was prepared using Dragon voice recognition software and may include unintentional dictation errors.   Blake Divine, MD 02/09/23 [phone]

## 2023-02-09 NOTE — ED Triage Notes (Signed)
Patient c/o upper abdominal pain and stabbing left sided chest pain with radiation to left arm X1 week

## 2024-02-05 ENCOUNTER — Ambulatory Visit (HOSPITAL_COMMUNITY): Admission: EM | Admit: 2024-02-05 | Discharge: 2024-02-05 | Disposition: A | Discharge status: Home / Self Care

## 2024-02-05 ENCOUNTER — Encounter (HOSPITAL_COMMUNITY): Payer: Self-pay

## 2024-02-05 DIAGNOSIS — L03012 Cellulitis of left finger: Secondary | ICD-10-CM | POA: Diagnosis not present

## 2024-02-05 MED ORDER — DOXYCYCLINE HYCLATE 100 MG PO CAPS: 100.0000 mg | ORAL_CAPSULE | Freq: Two times a day (BID) | ORAL | 0 refills | Status: AC

## 2024-02-05 NOTE — ED Triage Notes (Signed)
 Pt c/o swelling and tender around nail to lt ring finger x1wk after trimming his nails.

## 2024-02-05 NOTE — ED Provider Notes (Signed)
 [facility]  Note:  This document was prepared using Dragon voice recognition software and may include unintentional dictation errors.  MRN: [id number] DOB: 1986/09/19  Subjective:   Joseph Hendrix is a 38 y.o. male presenting for mild erythema tenderness nailbed of left fourth finger x 2 to 3 days.  Patient reports that he trimmed his fingernails a week ago and since that time he has developed some redness and inflammation at the border of his fingernail.  Patient denies taking any over-the-counter ointments or creams to treat symptoms.  Patient has not taken any pain medication to treat symptoms.  Patient has no fever, drainage, severe pain.  No current facility-administered medications for this encounter.  Current Outpatient Medications:    doxycycline (VIBRAMYCIN) 100 MG capsule, Take 1 capsule (100 mg total) by mouth 2 (two) times daily for 5 days., Disp: 10 capsule, Rfl: 0   losartan (COZAAR) 25 MG tablet, Take 25 mg by mouth daily., Disp: , Rfl:    pantoprazole (PROTONIX) 40 MG tablet, Take 1 tablet (40 mg total) by mouth 2 (two) times daily before a meal., Disp: 60 tablet, Rfl: 2   No Known Allergies  Past Medical History:  Diagnosis Date   Anxiety    Constipation 03/27/2019   Diabetes mellitus without complication (HCC)    Elevated BP without diagnosis of hypertension 08/09/2016   Gastroesophageal reflux disease without esophagitis 09/08/2016   History of gonorrhea    IBS (irritable bowel syndrome)    Musculoskeletal chest pain 03/27/2019     Past Surgical History:  Procedure Laterality Date   NO PAST SURGERIES      Family History  Problem Relation Age of Onset   Diabetes Mother    Hyperlipidemia Mother    Kidney failure Mother    Diabetes Father    Hyperlipidemia Father    Diabetes Brother    Hyperlipidemia Brother    Heart attack Neg Hx    Colon cancer Neg Hx    Esophageal cancer Neg Hx     Social History   Tobacco Use   Smoking status:  Never   Smokeless tobacco: Never  Vaping Use   Vaping status: Never Used  Substance Use Topics   Alcohol use: No   Drug use: No    ROS Refer to HPI for ROS details.  Objective:   Vitals: BP (!) 151/103 (BP Location: Left Arm)   Pulse 95   Temp 98.7 F (37.1 C) (Oral)   Resp 18   SpO2 97%   Physical Exam Vitals and nursing note reviewed.  Constitutional:      General: He is not in acute distress.    Appearance: He is well-developed. He is not ill-appearing or toxic-appearing.  HENT:     Head: Normocephalic.  Cardiovascular:     Rate and Rhythm: Normal rate.  Pulmonary:     Effort: Pulmonary effort is normal. No respiratory distress.  Skin:    General: Skin is warm and dry.     Capillary Refill: Capillary refill takes less than 2 seconds.     Findings: Abscess and erythema present. No rash or wound.  Neurological:     Mental Status: He is alert.  Psychiatric:        Mood and Affect: Mood normal.     Procedures  No results found for this or any previous visit (from the past 24 hours).  Assessment and Plan :   PDMP not reviewed this encounter.  1. Paronychia of finger, left  Paronychia left fourth finger -Take prescribed doxycycline 2 mg twice daily for 3 days for Paronychia of the finger -Take ibuprofen or Tylenol for pain patient finger -Recommend soaking finger 2-3 times a day apply antibiotic ointment directly to nail border near affected skin -Continue to monitor symptoms for any change, if there is any escalation of symptoms follow-up for further evaluation  Tonny Bollman, Mitcheal Sweetin B, NP 02/05/24 1308

## 2024-02-05 NOTE — Discharge Instructions (Addendum)
 Paronychia left fourth finger -Take prescribed doxycycline 2 mg twice daily for 3 days for Paronychia of the finger -Take ibuprofen or Tylenol for pain patient finger -Recommend soaking finger 2-3 times a day apply antibiotic ointment directly to nail border near affected skin -Continue to monitor symptoms for any change, if there is any escalation of symptoms follow-up for further evaluation

## 2024-04-23 ENCOUNTER — Ambulatory Visit: Admitting: Physician Assistant

## 2024-05-14 ENCOUNTER — Ambulatory Visit: Admitting: Physician Assistant

## 2024-05-18 ENCOUNTER — Ambulatory Visit: Admitting: Physician Assistant
